# Patient Record
Sex: Male | Born: 1975 | Race: Black or African American | Hispanic: No | Marital: Married | Smoking: Former smoker
Health system: Southern US, Community
[De-identification: ages and names within clinical notes are randomized; demographics above are authoritative.]

---

## 2002-08-18 HISTORY — PX: HERNIA REPAIR: SHX51

## 2015-06-07 ENCOUNTER — Ambulatory Visit (INDEPENDENT_AMBULATORY_CARE_PROVIDER_SITE_OTHER): Payer: 59 | Admitting: Physician Assistant

## 2015-06-07 ENCOUNTER — Encounter: Payer: Self-pay | Admitting: Physician Assistant

## 2015-06-07 VITALS — BP 98/60 | HR 63 | Temp 98.3°F | Resp 16 | Ht 69.0 in | Wt 173.2 lb

## 2015-06-07 DIAGNOSIS — Z23 Encounter for immunization: Secondary | ICD-10-CM

## 2015-06-07 DIAGNOSIS — Z833 Family history of diabetes mellitus: Secondary | ICD-10-CM

## 2015-06-07 DIAGNOSIS — Z8342 Family history of familial hypercholesterolemia: Secondary | ICD-10-CM

## 2015-06-07 DIAGNOSIS — K4091 Unilateral inguinal hernia, without obstruction or gangrene, recurrent: Secondary | ICD-10-CM

## 2015-06-07 DIAGNOSIS — Z0000 Encounter for general adult medical examination without abnormal findings: Secondary | ICD-10-CM

## 2015-06-07 DIAGNOSIS — K429 Umbilical hernia without obstruction or gangrene: Secondary | ICD-10-CM

## 2015-06-07 DIAGNOSIS — Z832 Family history of diseases of the blood and blood-forming organs and certain disorders involving the immune mechanism: Secondary | ICD-10-CM | POA: Diagnosis not present

## 2015-06-07 DIAGNOSIS — Z8269 Family history of other diseases of the musculoskeletal system and connective tissue: Secondary | ICD-10-CM

## 2015-06-07 NOTE — Progress Notes (Signed)
Patient: Vincent Clark, Male    DOB: 1976-02-22, 39 y.o.   MRN: [id number] Visit Date: 06/07/2015  Today's Provider: Margaretann LovelessJennifer M Burnette, PA-C   Chief Complaint  Patient presents with  . Establish Care   Subjective:    Annual physical exam Vincent Brooklynnthony Honeycutt is a 39 y.o. male who presents today for health maintenance and complete physical. He feels well. He reports exercising, he does a lot of walking at work. He reports he is sleeping poorly, gets 4-5 hours of sleep. His sleep is decreased secondary to work. 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 He states he is essentially working from sun up to sundown and just does not get a lot of time to sleep. This is causing him to have increased fatigue. He also recently had his right upper wisdom tooth removed secondary to having the tooth crack. This was done 2 days ago. He states he is doing well postoperatively. There is no family history of prostate cancer or colon cancer. He does have a strong family history of diabetes. He also has a strong family history of coronary atherosclerosis and hypercholesterolemia. He also has a positive family history of lupus in his mother. He has a family history of bipolar disorder.  Review of Systems  Constitutional: Positive for appetite change and fatigue (due to work schedule). Negative for fever, chills and unexpected weight change.  HENT: Positive for congestion and dental problem (recent wisdom tooth extraction 2 days ago for cracked tooth). Negative for postnasal drip, rhinorrhea, sinus pressure, sneezing, tinnitus and trouble swallowing.   Eyes: Negative.   Respiratory: Positive for cough. Negative for choking, chest tightness, shortness of breath and wheezing.   Cardiovascular: Negative.   Gastrointestinal: Positive for abdominal pain (old hernia repair that did not heal appropriately). Negative for nausea, vomiting, diarrhea, constipation, blood in stool, anal bleeding and rectal  pain.  Endocrine: Negative.   Genitourinary: Negative.   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  Allergic/Immunologic: Negative.   Neurological: Positive for numbness (finger tips). Negative for dizziness, tremors, seizures, syncope, weakness, light-headedness and headaches.  Hematological: Negative.   Psychiatric/Behavioral: Negative.     Social History He  reports that he has been smoking.  He has never used smokeless tobacco. He reports that he drinks alcohol. He reports that he does not use illicit drugs. Social History   Social History  . Marital Status: Single    Spouse Name: N/A  . Number of Children: N/A  . Years of Education: N/A   Social History Main Topics  . Smoking status: Current Every Day Smoker  . Smokeless tobacco: Never Used  . Alcohol Use: Yes     Comment: Occasional  . Drug Use: No  . Sexual Activity: Not Asked   Other Topics Concern  . None   Social History Narrative  . None    There are no active problems to display for this patient.   Past Surgical History  Procedure Laterality Date  . Hernia repair  2004    Family History  Family Status  Relation Status Death Age  . Mother Alive   . Father Alive   . Brother Alive   . Maternal Uncle Alive   . Paternal Manufacturing systems engineerUncle Alive   . Maternal Grandmother Deceased   . Maternal Uncle Alive    His family history includes Asthma in his brother; Bipolar disorder  in his father and maternal uncle; Diabetes in his maternal grandmother and mother; Heart disease in his father; Mental illness in his maternal uncle.    No Known Allergies  Previous Medications   AMOXICILLIN (AMOXIL) 500 MG CAPSULE       OXYCODONE-ACETAMINOPHEN (PERCOCET/ROXICET) 5-325 MG TABLET        Patient Care Team: Margaretann Loveless, PA-C as PCP - General (Family Medicine)     Objective:   Vitals: BP 98/60 mmHg  Pulse 63  Temp(Src) 98.3 F  (36.8 C) (Oral)  Resp 16  Ht  (1.753 m)  Wt 173 lb 3.2 oz (78.563 kg)  BMI 25.57 kg/m2   Physical Exam  Constitutional: He is oriented to person, place, and time. He appears well-developed and well-nourished. No distress.  HENT:  Head: Normocephalic and atraumatic.  Right Ear: Hearing, tympanic membrane, external ear and ear canal normal.  Left Ear: Hearing, tympanic membrane, external ear and ear canal normal.  Nose: Nose normal. Right sinus exhibits no maxillary sinus tenderness and no frontal sinus tenderness. Left sinus exhibits no maxillary sinus tenderness and no frontal sinus tenderness.  Mouth/Throat: Uvula is midline, oropharynx is clear and moist and mucous membranes are normal. No oropharyngeal exudate.    Eyes: Conjunctivae and EOM are normal. Pupils are equal, round, and reactive to light. Right eye exhibits no discharge.  Neck: Trachea normal and normal range of motion. Neck supple. Muscular tenderness present. No spinous process tenderness present. No rigidity. No tracheal deviation, no edema, no erythema and normal range of motion present. No thyroid mass and no thyromegaly present.  Cardiovascular: Normal rate, regular rhythm, normal heart sounds and intact distal pulses.  Exam reveals no gallop and no friction rub.   No murmur heard. Pulmonary/Chest: Effort normal and breath sounds normal. No respiratory distress. He has no wheezes. He has no rales.  Abdominal: Soft. Normal appearance and bowel sounds are normal. He exhibits no distension and no mass. There is no hepatosplenomegaly. There is no tenderness. There is no rebound, no guarding and no CVA tenderness. A hernia is present. Hernia confirmed positive in the right inguinal area.    Musculoskeletal: Normal range of motion. He exhibits no edema or tenderness.  Lymphadenopathy:    He has no cervical adenopathy.  Neurological: He is alert and oriented to person, place, and time. He has normal reflexes. No cranial  nerve deficit. He exhibits normal muscle tone. Coordination normal.  Skin: Skin is warm and dry. No rash noted. He is not diaphoretic. No erythema.  Psychiatric: He has a normal mood and affect. His behavior is normal. Judgment and thought content normal.  Vitals reviewed.    Depression Screen No flowsheet data found.    Assessment & Plan:     Routine Health Maintenance and Physical Exam  1. Annual physical exam Physical exam was normal today with the exception of the 2 hernias reported below. These were both reducible and nontender today. He does state that occasionally he will get some tenderness in the right inguinal hernia. We will try to get labs as below. We will follow-up pending lab results or in one year for repeat annual exam if labs are normal. - TSH - CBC with Differential - Comprehensive Metabolic Panel (CMET)  2. Unilateral recurrent inguinal hernia without obstruction or gangrene He states a new while back he had a right inguinal hernia that he did undergo surgical repair. He states that he did not follow the lifting restrictions and on second  day with a heavy object and felt a pop and then felt the hernia return. It is still soft and reducible. It was nontender today on exam but he does state that he can have tenderness in the area of the hernia on days where he is very active or has to do a lot of lifting. We will continue to monitor this. I advised him that if the hernia grows in size, is unable to reduce it, or becomes very painful he is to call the office and we will consider referral to general surgery for second repair.  3. Umbilical hernia without obstruction and without gangrene He does also have a tiny umbilical hernia. This is reducible and nontender. I advised him as a did above for the inguinal hernia. He voiced understanding and will call if it worsens. If not we will reevaluate in one year at his repeat annual physical.  4. Family history of diabetes  mellitus in mother He does have a strong family history of diabetes in his mother and his siblings as well as grandparents. We will check his hemoglobin A1c. I will follow-up with him pending lab results or in one year if lab results are normal. - HgB A1c  5. Family history of systemic lupus erythematosus (SLE) in mother He does have a family history of lupus in his mother. He has never been checked for lupus. We will check ANA. Follow-up pending lab results or in one year if labs are normal. - ANA w/Reflex if Positive  6. Family history of high cholesterol History of CAD and cholesterol in his father. We will check lipid panel and follow-up pending lab results. If labs are normal I will follow-up with him in one year at his annual physical. - Lipid panel  7. Need for immunization against influenza Flu vaccine was given today without complication. - Flu Vaccine QUAD 36+ mos IM (Fluarix)   Exercise Activities and Dietary recommendations Goals    None       There is no immunization history on file for this patient.  Health Maintenance  Topic Date Due  . HIV Screening  08/14/1993  . TETANUS/TDAP  08/14/1997  . INFLUENZA VACCINE  03/19/2015      Discussed health benefits of physical activity, and encouraged him to engage in regular exercise appropriate for his age and condition.    --------------------------------------------------------------------

## 2015-06-07 NOTE — Patient Instructions (Signed)

## 2015-06-08 ENCOUNTER — Telehealth: Payer: Self-pay

## 2015-06-08 LAB — HEMOGLOBIN A1C
Est. average glucose Bld gHb Est-mCnc: 114 mg/dL
Hgb A1c MFr Bld: 5.6 % (ref 4.8–5.6)

## 2015-06-08 LAB — CBC WITH DIFFERENTIAL/PLATELET
BASOS: 1 %
Basophils Absolute: 0.1 10*3/uL (ref 0.0–0.2)
EOS (ABSOLUTE): 0.3 10*3/uL (ref 0.0–0.4)
EOS: 3 %
Hematocrit: 42.9 % (ref 37.5–51.0)
Hemoglobin: 14.5 g/dL (ref 12.6–17.7)
IMMATURE GRANULOCYTES: 0 %
Immature Grans (Abs): 0 10*3/uL (ref 0.0–0.1)
LYMPHS: 28 %
Lymphocytes Absolute: 2.9 10*3/uL (ref 0.7–3.1)
MCH: 26.8 pg (ref 26.6–33.0)
MCHC: 33.8 g/dL (ref 31.5–35.7)
MCV: 79 fL (ref 79–97)
MONOCYTES: 6 %
Monocytes Absolute: 0.7 10*3/uL (ref 0.1–0.9)
NEUTROS ABS: 6.6 10*3/uL (ref 1.4–7.0)
NEUTROS PCT: 62 %
PLATELETS: 264 10*3/uL (ref 150–379)
RBC: 5.42 x10E6/uL (ref 4.14–5.80)
RDW: 13.7 % (ref 12.3–15.4)
WBC: 10.6 10*3/uL (ref 3.4–10.8)

## 2015-06-08 LAB — TSH: TSH: 0.312 u[IU]/mL — ABNORMAL LOW (ref 0.450–4.500)

## 2015-06-08 LAB — LIPID PANEL
Chol/HDL Ratio: 2.5 ratio units (ref 0.0–5.0)
Cholesterol, Total: 123 mg/dL (ref 100–199)
HDL: 50 mg/dL (ref >39)
LDL Calculated: 55 mg/dL (ref 0–99)
Triglycerides: 90 mg/dL (ref 0–149)
VLDL CHOLESTEROL CAL: 18 mg/dL (ref 5–40)

## 2015-06-08 LAB — ANA W/REFLEX IF POSITIVE: Anti Nuclear Antibody(ANA): NEGATIVE

## 2015-06-08 LAB — COMPREHENSIVE METABOLIC PANEL
A/G RATIO: 1.6 (ref 1.1–2.5)
ALT: 13 IU/L (ref 0–44)
AST: 12 IU/L (ref 0–40)
Albumin: 4.4 g/dL (ref 3.5–5.5)
Alkaline Phosphatase: 52 IU/L (ref 39–117)
BILIRUBIN TOTAL: 0.7 mg/dL (ref 0.0–1.2)
BUN/Creatinine Ratio: 10 (ref 8–19)
BUN: 13 mg/dL (ref 6–20)
CALCIUM: 9.8 mg/dL (ref 8.7–10.2)
CHLORIDE: 102 mmol/L (ref 97–106)
CO2: 25 mmol/L (ref 18–29)
Creatinine, Ser: 1.3 mg/dL — ABNORMAL HIGH (ref 0.76–1.27)
GFR calc Af Amer: 81 mL/min/{1.73_m2} (ref >59)
GFR, EST NON AFRICAN AMERICAN: 70 mL/min/{1.73_m2} (ref >59)
GLUCOSE: 80 mg/dL (ref 65–99)
Globulin, Total: 2.7 g/dL (ref 1.5–4.5)
POTASSIUM: 4.7 mmol/L (ref 3.5–5.2)
Sodium: 143 mmol/L (ref 136–144)
Total Protein: 7.1 g/dL (ref 6.0–8.5)

## 2015-06-08 NOTE — Telephone Encounter (Signed)
-----   Message from Margaretann LovelessJennifer M Burnette, [place] sent at 06/08/2015  2:23 PM EDT ----- All labs are stable and WNL with exception to thyroid stimulating hormone which is slightly decreased.  We will recheck this again in 6 months to see if it is still low.  ANA was negative.  This was the test that test for factors with lupus.

## 2015-06-08 NOTE — Telephone Encounter (Signed)
Patient advised as directed below.  Thanks,  -Joseline 

## 2015-06-13 ENCOUNTER — Encounter: Payer: Self-pay | Admitting: Physician Assistant

## 2015-06-14 MED ORDER — ONE-A-DAY MENS PO TABS: 1.0000 | ORAL_TABLET | Freq: Every day | ORAL | Status: DC

## 2015-06-29 ENCOUNTER — Other Ambulatory Visit: Payer: Self-pay | Admitting: Physician Assistant

## 2015-06-29 DIAGNOSIS — Z91030 Bee allergy status: Secondary | ICD-10-CM

## 2015-06-29 MED ORDER — EPINEPHRINE 0.3 MG/0.3ML IJ SOAJ: 0.3000 mg | Freq: Once | INTRAMUSCULAR | Status: DC

## 2015-07-01 ENCOUNTER — Emergency Department
Admission: EM | Admit: 2015-07-01 | Discharge: 2015-07-01 | Disposition: A | Discharge status: Home / Self Care | Payer: 59 | Attending: Emergency Medicine | Admitting: Emergency Medicine

## 2015-07-01 DIAGNOSIS — Z79899 Other long term (current) drug therapy: Secondary | ICD-10-CM | POA: Diagnosis not present

## 2015-07-01 DIAGNOSIS — Y998 Other external cause status: Secondary | ICD-10-CM | POA: Diagnosis not present

## 2015-07-01 DIAGNOSIS — W458XXA Other foreign body or object entering through skin, initial encounter: Secondary | ICD-10-CM | POA: Diagnosis not present

## 2015-07-01 DIAGNOSIS — Y92009 Unspecified place in unspecified non-institutional (private) residence as the place of occurrence of the external cause: Secondary | ICD-10-CM | POA: Insufficient documentation

## 2015-07-01 DIAGNOSIS — Y9389 Activity, other specified: Secondary | ICD-10-CM | POA: Diagnosis not present

## 2015-07-01 DIAGNOSIS — Z23 Encounter for immunization: Secondary | ICD-10-CM | POA: Diagnosis not present

## 2015-07-01 DIAGNOSIS — S61012A Laceration without foreign body of left thumb without damage to nail, initial encounter: Secondary | ICD-10-CM

## 2015-07-01 DIAGNOSIS — F17200 Nicotine dependence, unspecified, uncomplicated: Secondary | ICD-10-CM | POA: Diagnosis not present

## 2015-07-01 MED ORDER — LIDOCAINE HCL (PF) 1 % IJ SOLN
2.0000 mL | Freq: Once | INTRAMUSCULAR | Status: AC
  Administered 2015-07-01: 2 mL

## 2015-07-01 MED ORDER — TETANUS-DIPHTH-ACELL PERTUSSIS 5-2.5-18.5 LF-MCG/0.5 IM SUSP
0.5000 mL | Freq: Once | INTRAMUSCULAR | Status: AC
  Administered 2015-07-01: 0.5 mL via INTRAMUSCULAR

## 2015-07-01 MED ORDER — LIDOCAINE HCL (PF) 1 % IJ SOLN
INTRAMUSCULAR | Status: AC
  Filled 2015-07-01: qty 5

## 2015-07-01 MED ORDER — TETANUS-DIPHTH-ACELL PERTUSSIS 5-2.5-18.5 LF-MCG/0.5 IM SUSP
INTRAMUSCULAR | Status: AC
  Filled 2015-07-01: qty 0.5

## 2015-07-01 NOTE — ED Notes (Signed)
Pt states that he cut his left thumb pad on a broken metal broom handle, bleeding under control

## 2015-07-01 NOTE — Discharge Instructions (Signed)
Stitches, Staples, or Adhesive Wound Closure °Health care providers use stitches (sutures), staples, and certain glue (skin adhesives) to hold skin together while it heals (wound closure). You may need this treatment after you have surgery or if you cut your skin accidentally. These methods help your skin to heal more quickly and make it less likely that you will have a scar. A wound may take several months to heal completely. °The type of wound you have determines when your wound gets closed. In most cases, the wound is closed as soon as possible (primary skin closure). Sometimes, closure is delayed so the wound can be cleaned and allowed to heal naturally. This reduces the chance of infection. Delayed closure may be needed if your wound: °· Is caused by a bite. °· Happened more than 6 hours ago. °· Involves loss of skin or the tissues under the skin. °· Has dirt or debris in it that cannot be removed. °· Is infected. °WHAT ARE THE DIFFERENT KINDS OF WOUND CLOSURES? °There are many options for wound closure. The one that your health care provider uses depends on how deep and how large your wound is. °Adhesive Glue °To use this type of glue to close a wound, your health care provider holds the edges of the wound together and paints the glue on the surface of your skin. You may need more than one layer of glue. Then the wound may be covered with a light bandage (dressing). °This type of skin closure may be used for small wounds that are not deep (superficial). Using glue for wound closure is less painful than other methods. It does not require a medicine that numbs the area (local anesthetic). This method also leaves nothing to be removed. Adhesive glue is often used for children and on facial wounds. °Adhesive glue cannot be used for wounds that are deep, uneven, or bleeding. It is not used inside of a wound.  °Adhesive Strips °These strips are made of sticky (adhesive), porous paper. They are applied across your  skin edges like a regular adhesive bandage. You leave them on until they fall off. °Adhesive strips may be used to close very superficial wounds. They may also be used along with sutures to improve the closure of your skin edges.  °Sutures °Sutures are the oldest method of wound closure. Sutures can be made from natural substances, such as silk, or from synthetic materials, such as nylon and steel. They can be made from a material that your body can break down as your wound heals (absorbable), or they can be made from a material that needs to be removed from your skin (nonabsorbable). They come in many different strengths and sizes. °Your health care provider attaches the sutures to a steel needle on one end. Sutures can be passed through your skin, or through the tissues beneath your skin. Then they are tied and cut. Your skin edges may be closed in one continuous stitch or in separate stitches. °Sutures are strong and can be used for all kinds of wounds. Absorbable sutures may be used to close tissues under the skin. The disadvantage of sutures is that they may cause skin reactions that lead to infection. Nonabsorbable sutures need to be removed. °Staples °When surgical staples are used to close a wound, the edges of your skin on both sides of the wound are brought close together. A staple is placed across the wound, and an instrument secures the edges together. Staples are often used to close surgical cuts (  incisions). °Staples are faster to use than sutures, and they cause less skin reaction. Staples need to be removed using a tool that bends the staples away from your skin. °HOW DO I CARE FOR MY WOUND CLOSURE? °· Take medicines only as directed by your health care provider. °· If you were prescribed an antibiotic medicine for your wound, finish it all even if you start to feel better. °· Use ointments or creams only as directed by your health care provider. °· Wash your hands with soap and water before and  after touching your wound. °· Do not soak your wound in water. Do not take baths, swim, or use a hot tub until your health care provider approves. °· Ask your health care provider when you can start showering. Cover your wound if directed by your health care provider. °· Do not take out your own sutures or staples. °· Do not pick at your wound. Picking can cause an infection. °· Keep all follow-up visits as directed by your health care provider. This is important. °HOW LONG WILL I HAVE MY WOUND CLOSURE? °· Leave adhesive glue on your skin until the glue peels away. °· Leave adhesive strips on your skin until the strips fall off. °· Absorbable sutures will dissolve within several days. °· Nonabsorbable sutures and staples must be removed. The location of the wound will determine how long they stay in. This can range from several days to a couple of weeks. °WHEN SHOULD I SEEK HELP FOR MY WOUND CLOSURE? °Contact your health care provider if: °· You have a fever. °· You have chills. °· You have drainage, redness, swelling, or pain at your wound. °· There is a bad smell coming from your wound. °· The skin edges of your wound start to separate after your sutures have been removed. °· Your wound becomes thick, raised, and darker in color after your sutures come out (scarring). °  °This information is not intended to replace advice given to you by your health care provider. Make sure you discuss any questions you have with your health care provider. °  °Document Released: 04/29/2001 Document Revised: 08/25/2014 Document Reviewed: 01/11/2014 °Elsevier Interactive Patient Education ©2016 [employer] ° °Sutured Wound Care °Sutures are stitches that can be used to close wounds. Taking care of your wound properly can help to prevent pain and infection. It can also help your wound to heal more quickly. °HOW TO CARE FOR YOUR SUTURED WOUND °Wound Care °· Keep the wound clean and dry. °· If you were given a bandage (dressing), you  should change it at least once per day or as directed by your health care provider. You should also change it if it becomes wet or dirty. °· Keep the wound completely dry for the first 24 hours or as directed by your health care provider. After that time, you may shower or bathe. However, make sure that the wound is not soaked in water until the sutures have been removed. °· Clean the wound one time each day or as directed by your health care provider. °¨ Wash the wound with soap and water. °¨ Rinse the wound with water to remove all soap. °¨ Pat the wound dry with a clean towel. Do not rub the wound. °· After cleaning the wound, apply a thin layer of antibiotic ointment as directed by your health care provider. This will help to prevent infection and keep the dressing from sticking to the wound. °· Have the sutures removed as directed by your   health care provider. °General Instructions °· Take or apply medicines only as directed by your health care provider. °· To help prevent scarring, make sure to cover your wound with sunscreen whenever you are outside after the sutures are removed and the wound is healed. Make sure to wear a sunscreen of at least 30 SPF. °· If you were prescribed an antibiotic medicine or ointment, finish all of it even if you start to feel better. °· Do not scratch or pick at the wound. °· Keep all follow-up visits as directed by your health care provider. This is important. °· Check your wound every day for signs of infection. Watch for:   °¨ Redness, swelling, or pain. °¨ Fluid, blood, or pus. °· Raise (elevate) the injured area above the level of your heart while you are sitting or lying down, if possible. °· Avoid stretching your wound. °· Drink enough fluids to keep your urine clear or pale yellow. °SEEK MEDICAL CARE IF: °· You received a tetanus shot and you have swelling, severe pain, redness, or bleeding at the injection site. °· You have a fever. °· A wound that was closed breaks  open. °· You notice a bad smell coming from the wound. °· You notice something coming out of the wound, such as wood or glass. °· Your pain is not controlled with medicine. °· You have increased redness, swelling, or pain at the site of your wound. °· You have fluid, blood, or pus coming from your wound. °· You notice a change in the color of your skin near your wound. °· You need to change the dressing frequently due to fluid, blood, or pus draining from the wound. °· You develop a new rash. °· You develop numbness around the wound. °SEEK IMMEDIATE MEDICAL CARE IF: °· You develop severe swelling around the injury site. °· Your pain suddenly increases and is severe. °· You develop painful lumps near the wound or on skin that is anywhere on your body. °· You have a red streak going away from your wound. °· The wound is on your hand or foot and you cannot properly move a finger or toe. °· The wound is on your hand or foot and you notice that your fingers or toes look pale or bluish. °  °This information is not intended to replace advice given to you by your health care provider. Make sure you discuss any questions you have with your health care provider. °  °Document Released: 09/11/2004 Document Revised: 12/19/2014 Document Reviewed: 03/16/2013 °Elsevier Interactive Patient Education ©2016 [employer] ° °

## 2015-07-01 NOTE — ED Provider Notes (Signed)
CSN: [id number]     Arrival date & time 07/01/15  2142 History   First MD Initiated Contact with Patient 07/01/15 2203     Chief Complaint  Patient presents with  . Laceration     (Consider location/radiation/quality/duration/timing/severity/associated sxs/prior Treatment) HPI  39 year old male presents to emergency department for evaluation of left thumb laceration. Patient states he was sleeping with a metal broom at home, cut his left thumb on the broom. Injury occurred just prior to arrival. Pain is mild. No numbness or tingling. No limited range of motion. He has not taken anything for pain. His tetanus is not up-to-date. Patient is right-hand dominant.  No past medical history on file. Past Surgical History  Procedure Laterality Date  . Hernia repair  2004   Family History  Problem Relation Age of Onset  . Diabetes Mother   . Heart disease Father   . Bipolar disorder Father   . Asthma Brother   . Bipolar disorder Maternal Uncle   . Diabetes Maternal Grandmother   . Mental illness Maternal Uncle    Social History  Substance Use Topics  . Smoking status: Current Every Day Smoker  . Smokeless tobacco: Never Used  . Alcohol Use: Yes     Comment: Occasional    Review of Systems  Constitutional: Negative.  Negative for fever, chills, activity change and appetite change.  HENT: Negative for congestion, ear pain, mouth sores, rhinorrhea, sinus pressure, sore throat and trouble swallowing.   Eyes: Negative for photophobia, pain and discharge.  Respiratory: Negative for cough, chest tightness and shortness of breath.   Cardiovascular: Negative for chest pain and leg swelling.  Gastrointestinal: Negative for nausea, vomiting, abdominal pain, diarrhea and abdominal distention.  Genitourinary: Negative for dysuria and difficulty urinating.  Musculoskeletal: Negative for back pain, arthralgias and gait problem.  Skin: Positive for wound ( left thumb). Negative for color change  and rash.  Neurological: Negative for dizziness and headaches.  Hematological: Negative for adenopathy.  Psychiatric/Behavioral: Negative for behavioral problems and agitation.      Allergies  Review of patient's allergies indicates no known allergies.  Home Medications   Prior to Admission medications   Medication Sig Start Date End Date Taking? Authorizing Provider  amoxicillin (AMOXIL) 500 MG capsule  05/28/15   Historical Provider, MD  EPINEPHrine 0.3 mg/0.3 mL IJ SOAJ injection Inject 0.3 mLs (0.3 mg total) into the muscle once. 06/29/15   Margaretann Loveless, PA-C  multivitamin (ONE-A-DAY MEN'S) TABS tablet Take 1 tablet by mouth daily. 06/14/15   Margaretann Loveless, PA-C  oxyCODONE-acetaminophen (PERCOCET/ROXICET) 5-325 MG tablet  06/04/15   Historical Provider, MD   BP 130/79 mmHg  Pulse 59  Temp(Src) 98.6 F (37 C) (Oral)  Resp 18  Ht  (1.803 m)  Wt 174 lb (78.926 kg)  BMI 24.28 kg/m2  SpO2 96% Physical Exam  Constitutional: He is oriented to person, place, and time. He appears well-developed and well-nourished.  HENT:  Head: Normocephalic and atraumatic.  Eyes: Conjunctivae and EOM are normal. Pupils are equal, round, and reactive to light.  Neck: Normal range of motion. Neck supple.  Cardiovascular: Normal rate, regular rhythm, normal heart sounds and intact distal pulses.   Pulmonary/Chest: Effort normal and breath sounds normal. No respiratory distress. He has no wheezes. He has no rales. He exhibits no tenderness.  Abdominal: Soft. Bowel sounds are normal. He exhibits no distension. There is no tenderness.  Musculoskeletal:  Left hand: Examination of left hand shows the patient  has no swelling warmth erythema. There is laceration to the volar aspect of the left thumb at the distal phalanx. There is no nail injury or soft tissue avulsion. Laceration is linear with no evidence of foreign body. Patient is non tender to palpation. He has full active flexion and  extension of the left thumb. He is neurovascularly intact.  Neurological: He is alert and oriented to person, place, and time.  Skin: Skin is warm and dry.  Psychiatric: He has a normal mood and affect. His behavior is normal. Judgment and thought content normal.    ED Course  Procedures (including critical care time) LACERATION REPAIR Performed by: Patience MuscaGAINES, THOMAS CHRISTOPHER Authorized by: Patience MuscaGAINES, THOMAS CHRISTOPHER Consent: Verbal consent obtained. Risks and benefits: risks, benefits and alternatives were discussed Consent given by: patient Patient identity confirmed: provided demographic data Prepped and Draped in normal sterile fashion Wound explored  Laceration Location: Left thumb  Laceration Length: 4 cm  No Foreign Bodies seen or palpated  Anesthesia: local infiltration  Local anesthetic: lidocaine 1% % without epinephrine  Anesthetic total: 1.5 ml  Irrigation method: syringe Amount of cleaning: standard  Skin closure: 5-0 nylon   Number of sutures: #5   Technique: Simple interrupted   Patient tolerance: Patient tolerated the procedure well with no immediate complications.  Labs Review Labs Reviewed - No data to display  Imaging Review No results found. I have personally reviewed and evaluated these images and lab results as part of my medical decision-making.   EKG Interpretation None      MDM   Final diagnoses:  Laceration of left thumb, initial encounter    39 year old male with left thumb laceration just prior to arrival. Tetanus was updated at the emergency department. Laceration was repaired with 5 nylon sutures. Patient will keep clean and dry. Follow-up in 7 days for suture removal. Patient with no signs of foreign body, neurological, tendon deficits.    Evon Slackhomas C Gaines, PA-C 07/01/15 2221  Jeanmarie PlantJames A McShane, MD 07/01/15 [phone]

## 2015-07-06 ENCOUNTER — Encounter: Payer: Self-pay | Admitting: Physician Assistant

## 2015-07-06 ENCOUNTER — Ambulatory Visit (INDEPENDENT_AMBULATORY_CARE_PROVIDER_SITE_OTHER): Payer: 59 | Admitting: Physician Assistant

## 2015-07-06 VITALS — BP 128/70 | HR 68 | Temp 98.1°F | Resp 16 | Wt 179.6 lb

## 2015-07-06 DIAGNOSIS — Z4802 Encounter for removal of sutures: Secondary | ICD-10-CM

## 2015-07-06 NOTE — Progress Notes (Signed)
       Patient: Vincent Clark Male    DOB: [birth year]   39 y.o.   MRN: [id number] Visit Date: 07/06/2015  Today's Provider: Margaretann LovelessJennifer M Burnette, PA-C   Chief Complaint  Patient presents with  . Follow-up    ER visit on 07/01/15   Subjective:    HPI Vincent Brooklynnthony Kable is a 39 year old male here following up on left thumb laceration. Patient was seen at the ED on 07/01/15 and is here for suture removal. He states that his dog and broke a metal broom in half and he was holding onto the edge of the metal broom trying to pick up some shards from it and it sliced right through his left thumb. [facility] and received sutures as well as a tetanus booster.     No Known Allergies Previous Medications   EPIPEN 2-PAK 0.3 MG/0.3ML SOAJ INJECTION       MULTIVITAMIN (ONE-A-DAY MEN'S) TABS TABLET    Take 1 tablet by mouth daily.    Review of Systems  Constitutional: Negative.   HENT: Negative.   Respiratory: Negative.   Cardiovascular: Negative.   Gastrointestinal: Negative.   Skin: Positive for wound (left thumb).    Social History  Substance Use Topics  . Smoking status: Current Every Day Smoker  . Smokeless tobacco: Never Used  . Alcohol Use: Yes     Comment: Occasional   Objective:   BP 128/70 mmHg  Pulse 68  Temp(Src) 98.1 F (36.7 C) (Oral)  Resp 16  Wt 179 lb 9.6 oz (81.466 kg)  Physical Exam  Constitutional: He appears well-developed and well-nourished. No distress.  Musculoskeletal:       Right hand: Normal.       Left hand: He exhibits laceration (left thumb palmar aspect with 5 sutures-healing well.). He exhibits normal range of motion and no tenderness. Normal sensation noted. Normal strength noted.       Hands: Skin: He is not diaphoretic.  Vitals reviewed.       Assessment & Plan:     1. Encounter for removal of sutures Sutures were removed today without complication. I did advise him to continue to keep the area clean and  dry. I did cover the laceration with a Band-Aid today. I did advise him also to try to avoid using his left thumb as much as possible until it continues to completely heal. I do feel it will continue to heal without complication. He is to call the office if he notices any redness, swelling, warmth, drainage or if he develops fevers, chills, nausea or vomiting.        Margaretann LovelessJennifer M Burnette, PA-C  [facility]

## 2015-07-06 NOTE — Patient Instructions (Signed)
Wound Care °Taking care of your wound properly can help to prevent pain and infection. It can also help your wound to heal more quickly.  °HOW TO CARE FOR YOUR WOUND  °· Take or apply over-the-counter and prescription medicines only as told by your health care provider. °· If you were prescribed antibiotic medicine, take or apply it as told by your health care provider. Do not stop using the antibiotic even if your condition improves. °· Clean the wound each day or as told by your health care provider. °¨ Wash the wound with mild soap and water. °¨ Rinse the wound with water to remove all soap. °¨ Pat the wound dry with a clean towel. Do not rub it. °· There are many different ways to close and cover a wound. For example, a wound can be covered with stitches (sutures), skin glue, or adhesive strips. Follow instructions from your health care provider about: °¨ How to take care of your wound. °¨ When and how you should change your bandage (dressing). °¨ When you should remove your dressing. °¨ Removing whatever was used to close your wound. °· Check your wound every day for signs of infection. Watch for: °¨ Redness, swelling, or pain. °¨ Fluid, blood, or pus. °· Keep the dressing dry until your health care provider says it can be removed. Do not take baths, swim, use a hot tub, or do anything that would put your wound underwater until your health care provider approves. °· Raise (elevate) the injured area above the level of your heart while you are sitting or lying down. °· Do not scratch or pick at the wound. °· Keep all follow-up visits as told by your health care provider. This is important. °SEEK MEDICAL CARE IF: °· You received a tetanus shot and you have swelling, severe pain, redness, or bleeding at the injection site. °· You have a fever. °· Your pain is not controlled with medicine. °· You have increased redness, swelling, or pain at the site of your wound. °· You have fluid, blood, or pus coming from your  wound. °· You notice a bad smell coming from your wound or your dressing. °SEEK IMMEDIATE MEDICAL CARE IF: °· You have a red streak going away from your wound. °  °This information is not intended to replace advice given to you by your health care provider. Make sure you discuss any questions you have with your health care provider. °  °Document Released: 05/13/2008 Document Revised: 12/19/2014 Document Reviewed: 07/31/2014 °Elsevier Interactive Patient Education ©2016 Elsevier Inc. ° °

## 2015-08-01 ENCOUNTER — Ambulatory Visit: Payer: Self-pay | Admitting: Physician Assistant

## 2015-08-23 ENCOUNTER — Other Ambulatory Visit: Payer: Self-pay

## 2015-08-24 ENCOUNTER — Encounter: Payer: Self-pay | Admitting: Physician Assistant

## 2015-08-24 ENCOUNTER — Ambulatory Visit (INDEPENDENT_AMBULATORY_CARE_PROVIDER_SITE_OTHER): Payer: 59 | Admitting: Physician Assistant

## 2015-08-24 VITALS — BP 90/58 | HR 72 | Temp 98.1°F | Resp 16 | Wt 168.5 lb

## 2015-08-24 DIAGNOSIS — B029 Zoster without complications: Secondary | ICD-10-CM

## 2015-08-24 DIAGNOSIS — R634 Abnormal weight loss: Secondary | ICD-10-CM

## 2015-08-24 DIAGNOSIS — F32A Depression, unspecified: Secondary | ICD-10-CM

## 2015-08-24 DIAGNOSIS — F329 Major depressive disorder, single episode, unspecified: Secondary | ICD-10-CM

## 2015-08-24 DIAGNOSIS — R630 Anorexia: Secondary | ICD-10-CM

## 2015-08-24 MED ORDER — MIRTAZAPINE 7.5 MG PO TABS: 7.5000 mg | ORAL_TABLET | Freq: Every day | ORAL | Status: DC

## 2015-08-24 MED ORDER — HYDROCODONE-ACETAMINOPHEN 5-325 MG PO TABS: 1.0000 | ORAL_TABLET | Freq: Four times a day (QID) | ORAL | Status: DC | PRN

## 2015-08-24 MED ORDER — VALACYCLOVIR HCL 1 G PO TABS: 1000.0000 mg | ORAL_TABLET | Freq: Two times a day (BID) | ORAL | Status: DC

## 2015-08-24 MED ORDER — BOOST HIGH PROTEIN PO LIQD: 1.0000 | Freq: Two times a day (BID) | ORAL | Status: DC

## 2015-08-24 NOTE — Telephone Encounter (Signed)
[facility]. I'm not sure if this will be covered but its worth a shot.

## 2015-08-24 NOTE — Patient Instructions (Signed)
Major Depressive Disorder Major depressive disorder is a mental illness. It also may be called clinical depression or unipolar depression. Major depressive disorder usually causes feelings of sadness, hopelessness, or helplessness. Some people with this disorder do not feel particularly sad but lose interest in doing things they used to enjoy (anhedonia). Major depressive disorder also can cause physical symptoms. It can interfere with work, school, relationships, and other normal everyday activities. The disorder varies in severity but is longer lasting and more serious than the sadness we all feel from time to time in our lives. Major depressive disorder often is triggered by stressful life events or major life changes. Examples of these triggers include divorce, loss of your job or home, a move, and the death of a family member or close friend. Sometimes this disorder occurs for no obvious reason at all. People who have family members with major depressive disorder or bipolar disorder are at higher risk for developing this disorder, with or without life stressors. Major depressive disorder can occur at any age. It may occur just once in your life (single episode major depressive disorder). It may occur multiple times (recurrent major depressive disorder). SYMPTOMS People with major depressive disorder have either anhedonia or depressed mood on nearly a daily basis for at least 2 weeks or longer. Symptoms of depressed mood include:  Feelings of sadness (blue or down in the dumps) or emptiness.  Feelings of hopelessness or helplessness.  Tearfulness or episodes of crying (may be observed by others).  Irritability (children and adolescents). In addition to depressed mood or anhedonia or both, people with this disorder have at least four of the following symptoms:  Difficulty sleeping or sleeping too much.   Significant change (increase or decrease) in appetite or weight.   Lack of energy or  motivation.  Feelings of guilt and worthlessness.   Difficulty concentrating, remembering, or making decisions.  Unusually slow movement (psychomotor retardation) or restlessness (as observed by others).   Recurrent wishes for death, recurrent thoughts of self-harm (suicide), or a suicide attempt. People with major depressive disorder commonly have persistent negative thoughts about themselves, other people, and the world. People with severe major depressive disorder may experiencedistorted beliefs or perceptions about the world (psychotic delusions). They also may see or hear things that are not real (psychotic hallucinations). DIAGNOSIS Major depressive disorder is diagnosed through an assessment by your health care provider. Your health care provider will ask aboutaspects of your daily life, such as mood,sleep, and appetite, to see if you have the diagnostic symptoms of major depressive disorder. Your health care provider may ask about your medical history and use of alcohol or drugs, including prescription medicines. Your health care provider also may do a physical exam and blood work. This is because certain medical conditions and the use of certain substances can cause major depressive disorder-like symptoms (secondary depression). Your health care provider also may refer you to a mental health specialist for further evaluation and treatment. TREATMENT It is important to recognize the symptoms of major depressive disorder and seek treatment. The following treatments can be prescribed for this disorder:   Medicine. Antidepressant medicines usually are prescribed. Antidepressant medicines are thought to correct chemical imbalances in the brain that are commonly associated with major depressive disorder. Other types of medicine may be added if the symptoms do not respond to antidepressant medicines alone or if psychotic delusions or hallucinations occur.  Talk therapy. Talk therapy can be  helpful in treating major depressive disorder by providing   support, education, and guidance. Certain types of talk therapy also can help with negative thinking (cognitive behavioral therapy) and with relationship issues that trigger this disorder (interpersonal therapy). A mental health specialist can help determine which treatment is best for you. Most people with major depressive disorder do well with a combination of medicine and talk therapy. Treatments involving electrical stimulation of the brain can be used in situations with extremely severe symptoms or when medicine and talk therapy do not work over time. These treatments include electroconvulsive therapy, transcranial magnetic stimulation, and vagal nerve stimulation.   This information is not intended to replace advice given to you by your health care provider. Make sure you discuss any questions you have with your health care provider.   Document Released: 11/29/2012 Document Revised: 08/25/2014 Document Reviewed: 11/29/2012 Elsevier Interactive Patient Education 2016 Elsevier Inc.  Mirtazapine tablets What is this medicine? MIRTAZAPINE (mir TAZ a peen) is used to treat depression. This medicine may be used for other purposes; ask your health care provider or pharmacist if you have questions. What should I tell my health care provider before I take this medicine? They need to know if you have any of these conditions: -bipolar disorder -glaucoma -kidney disease -liver disease -suicidal thoughts -an unusual or allergic reaction to mirtazapine, other medicines, foods, dyes, or preservatives -pregnant or trying to get pregnant -breast-feeding How should I use this medicine? Take this medicine by mouth with a glass of water. Follow the directions on the prescription label. Take your medicine at regular intervals. Do not take your medicine more often than directed. Do not stop taking this medicine suddenly except upon the advice of your  doctor. Stopping this medicine too quickly may cause serious side effects or your condition may worsen. A special MedGuide will be given to you by the pharmacist with each prescription and refill. Be sure to read this information carefully each time. Talk to your pediatrician regarding the use of this medicine in children. Special care may be needed. Overdosage: If you think you have taken too much of this medicine contact a poison control center or emergency room at once. NOTE: This medicine is only for you. Do not share this medicine with others. What if I miss a dose? If you miss a dose, take it as soon as you can. If it is almost time for your next dose, take only that dose. Do not take double or extra doses. What may interact with this medicine? Do not take this medicine with any of the following medications: -linezolid -MAOIs like Carbex, Eldepryl, Marplan, Nardil, and Parnate -methylene blue (injected into a vein) This medicine may also interact with the following medications: -alcohol -antiviral medicines for HIV or AIDS -certain medicines that treat or prevent blood clots like warfarin -certain medicines for depression, anxiety, or psychotic disturbances -certain medicines for fungal infections like ketoconazole and itraconazole -certain medicines for migraine headache like almotriptan, eletriptan, frovatriptan, naratriptan, rizatriptan, sumatriptan, zolmitriptan -certain medicines for seizures like carbamazepine or phenytoin -certain medicines for sleep -cimetidine -erythromycin -fentanyl -lithium -medicines for blood pressure -nefazodone -rasagiline -rifampin -supplements like St. John's wort, kava kava, valerian -tramadol -tryptophan This list may not describe all possible interactions. Give your health care provider a list of all the medicines, herbs, non-prescription drugs, or dietary supplements you use. Also tell them if you smoke, drink alcohol, or use illegal drugs.  Some items may interact with your medicine. What should I watch for while using this medicine? Tell your doctor if your  symptoms do not get better or if they get worse. Visit your doctor or health care professional for regular checks on your progress. Because it may take several weeks to see the full effects of this medicine, it is important to continue your treatment as prescribed by your doctor. Patients and their families should watch out for new or worsening thoughts of suicide or depression. Also watch out for sudden changes in feelings such as feeling anxious, agitated, panicky, irritable, hostile, aggressive, impulsive, severely restless, overly excited and hyperactive, or not being able to sleep. If this happens, especially at the beginning of treatment or after a change in dose, call your health care professional. Bonita QuinYou may get drowsy or dizzy. Do not drive, use machinery, or do anything that needs mental alertness until you know how this medicine affects you. Do not stand or sit up quickly, especially if you are an older patient. This reduces the risk of dizzy or fainting spells. Alcohol may interfere with the effect of this medicine. Avoid alcoholic drinks. This medicine may cause dry eyes and blurred vision. If you wear contact lenses you may feel some discomfort. Lubricating drops may help. See your eye doctor if the problem does not go away or is severe. Your mouth may get dry. Chewing sugarless gum or sucking hard candy, and drinking plenty of water may help. Contact your doctor if the problem does not go away or is severe. What side effects may I notice from receiving this medicine? Side effects that you should report to your doctor or health care professional as soon as possible: -allergic reactions like skin rash, itching or hives, swelling of the face, lips, or tongue -breathing problems -confusion -fever, sore throat, or mouth ulcers or blisters -flu like symptoms including fever,  chills, cough, muscle or joint aches and pains -stomach pain with nausea and/or vomiting -suicidal thoughts or other mood changes -swelling of the hands or feet -unusual bleeding or bruising -unusually weak or tired -vomiting Side effects that usually do not require medical attention (report to your doctor or health care professional if they continue or are bothersome): -constipation -increased appetite -weight gain This list may not describe all possible side effects. Call your doctor for medical advice about side effects. You may report side effects to FDA at 1-800-FDA-1088. Where should I keep my medicine? Keep out of the reach of children. Store at room temperature between 15 and 30 degrees C (59 and 86 degrees F) Protect from light and moisture. Throw away any unused medicine after the expiration date. NOTE: This sheet is a summary. It may not cover all possible information. If you have questions about this medicine, talk to your doctor, pharmacist, or health care provider.    2016, Elsevier/Gold Standard. (2013-02-25 13:11:19)

## 2015-08-24 NOTE — Progress Notes (Signed)
Patient: Vincent Clark Male    DOB: 1976/06/29   39 y.o.   MRN: [id number] Visit Date: 08/24/2015  Today's Provider: Margaretann Loveless, PA-C   Chief Complaint  Patient presents with  . Rash  . Poor Appetite  . Depression   Subjective:    Rash Chronicity: Possible recurrence of shingles. The problem has been gradually improving (The bumps are gone is just itching and burning) since onset. Location: Left side of [place]omen. The rash is characterized by burning and itchiness. He was exposed to nothing. Associated symptoms include fatigue. Pertinent negatives include no fever. Treatments tried: was using Valtrex.   Poor Appetite: Patient is concern about his appetite. Lately in the past week patient appetite has decreased and when gets home he prefers to sleep instead of eating something. Patient has been keeping track of his weight for the past three weeks. Patient last office visit weight was 179.6 lbs and today is 168.5 lbs. Patient states have been feeling more stress lately. Depression: Patient complains of depression. He complains of depressed mood, difficulty concentrating, fatigue, feelings of worthlessness/guilt, hopelessness, insomnia and weight loss. Onset was approximately 1 months ago, gradually worsening since that time.  He denies current suicidal and homicidal plan or intent.   Family history significant for alcoholism, anxiety, depression, heart disease and substance abuse.Possible organic causes contributing are: none.  Risk factors: positive family history in  grandmother, mother and uncle Previous treatment includes none.   He works a lot.  He has to be in [facility] around 7am and normally does not get home until after midnight. He gets up at 5 am to get ready for work. He was recently diagnosed with shingles approx. 3-4 weeks ago and has not had complete symptom resolution. He is stressed all the time and forgets to eat while he is at work. He states one day last week he  was so busy he didn't eat for 2 days and didn't even really realize it.   No Known Allergies Previous Medications   EPIPEN 2-PAK 0.3 MG/0.3ML SOAJ INJECTION       MULTIVITAMIN (ONE-A-DAY MEN'S) TABS TABLET    Take 1 tablet by mouth daily.   VALACYCLOVIR (VALTREX) 1000 MG TABLET    Reported on 08/24/2015    Review of Systems  Constitutional: Positive for appetite change, fatigue and unexpected weight change. Negative for fever.  HENT: Negative.   Eyes: Negative.   Respiratory: Negative.   Cardiovascular: Negative.   Gastrointestinal: Negative.   Endocrine: Negative.   Genitourinary: Negative.   Musculoskeletal: Negative.   Skin: Positive for rash.  Allergic/Immunologic: Negative.   Neurological: Negative.   Hematological: Negative.   Psychiatric/Behavioral: Positive for agitation. Negative for suicidal ideas, self-injury and dysphoric mood. The patient is nervous/anxious.   All other systems reviewed and are negative.   Social History  Substance Use Topics  . Smoking status: Current Every Day Smoker  . Smokeless tobacco: Never Used  . Alcohol Use: Yes     Comment: Occasional   Objective:   BP 90/58 mmHg  Pulse 72  Temp(Src) 98.1 F (36.7 C) (Oral)  Resp 16  Wt 168 lb 8 oz (76.431 kg)  Physical Exam  Constitutional: He appears well-developed and well-nourished. No distress.  HENT:  Head: Normocephalic and atraumatic.  Neck: Normal range of motion. Neck supple. No tracheal deviation present. No thyromegaly present.  Cardiovascular: Normal rate, regular rhythm and normal heart sounds.  Exam reveals no gallop and no  friction rub.   No murmur heard. Pulmonary/Chest: Effort normal and breath sounds normal. No respiratory distress. He has no wheezes. He has no rales.  Lymphadenopathy:    He has no cervical adenopathy.  Skin: Rash noted. Rash is vesicular. He is not diaphoretic.     Psychiatric: He has a normal mood and affect. His behavior is normal. Judgment and thought  content normal.  Vitals reviewed.       Assessment & Plan:     1. Shingles Being that there is a rash still present and pain I will treat with a second round of valtrex.  Pain medication given for pain management.  He is to call the office if symptoms persist after treatment. May consider treating with gabapentin for post herpetic neuralgia. - valACYclovir (VALTREX) 1000 MG tablet; Take 1 tablet (1,000 mg total) by mouth 2 (two) times daily.  Dispense: 14 tablet; Refill: 0 - HYDROcodone-acetaminophen (NORCO/VICODIN) 5-325 MG tablet; Take 1 tablet by mouth every 6 (six) hours as needed for moderate pain.  Dispense: 30 tablet; Refill: 0  2. Depression Worsening.  Most likely secondary to not eating, working too much and lack of sleep.  Will try remeron for increasing appetite and treating depression.  Advised that he needs to start taking days off work so that he can give his body time to rest and recover because he is making himself sick from working 7 days/16 hours per day. He voiced understanding.  I will see him back in 4 weeks to see how he is doing with the treatment.  He is to call the office in the meantime if symptoms worsen. - mirtazapine (REMERON) 7.5 MG tablet; Take 1 tablet (7.5 mg total) by mouth at bedtime.  Dispense: 60 tablet; Refill: 1  3. Loss of appetite See above medical treatment plan. - mirtazapine (REMERON) 7.5 MG tablet; Take 1 tablet (7.5 mg total) by mouth at bedtime.  Dispense: 60 tablet; Refill: 1       Margaretann LovelessJennifer M Raylene Carmickle, PA-C  [facility]

## 2015-09-05 ENCOUNTER — Emergency Department
Admission: EM | Admit: 2015-09-05 | Discharge: 2015-09-05 | Discharge status: Against Medical Advice | Payer: 59 | Attending: Emergency Medicine | Admitting: Emergency Medicine

## 2015-09-05 DIAGNOSIS — F17200 Nicotine dependence, unspecified, uncomplicated: Secondary | ICD-10-CM | POA: Insufficient documentation

## 2015-09-05 DIAGNOSIS — Y9389 Activity, other specified: Secondary | ICD-10-CM | POA: Diagnosis not present

## 2015-09-05 DIAGNOSIS — Y998 Other external cause status: Secondary | ICD-10-CM | POA: Diagnosis not present

## 2015-09-05 DIAGNOSIS — S199XXA Unspecified injury of neck, initial encounter: Secondary | ICD-10-CM | POA: Insufficient documentation

## 2015-09-05 DIAGNOSIS — Y92410 Unspecified street and highway as the place of occurrence of the external cause: Secondary | ICD-10-CM | POA: Insufficient documentation
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
## 2015-09-21 ENCOUNTER — Ambulatory Visit: Payer: 59 | Admitting: Physician Assistant

## 2015-10-26 ENCOUNTER — Ambulatory Visit: Payer: Self-pay | Admitting: Physician Assistant

## 2015-10-29 ENCOUNTER — Ambulatory Visit (INDEPENDENT_AMBULATORY_CARE_PROVIDER_SITE_OTHER): Payer: 59 | Admitting: Physician Assistant

## 2015-10-29 ENCOUNTER — Encounter: Payer: Self-pay | Admitting: Physician Assistant

## 2015-10-29 VITALS — BP 108/60 | HR 64 | Temp 98.0°F | Resp 16 | Wt 176.6 lb

## 2015-10-29 DIAGNOSIS — F419 Anxiety disorder, unspecified: Secondary | ICD-10-CM

## 2015-10-29 NOTE — Progress Notes (Signed)
Patient: Vincent Clark Male    DOB: 23-Jun-1976   39 y.o.   MRN: [id number] Visit Date: 10/29/2015  Today's Provider: Margaretann Loveless, PA-C   Chief Complaint  Patient presents with  . Chest Pain   Subjective:    Chest Pain  This is a new problem. The current episode started 1 to 4 weeks ago (He states he has been having chest pain in the last few months but now they have been more frequently in the last week in a half). The problem occurs daily. The problem has been gradually worsening. The pain is present in the epigastric region. The pain is at a severity of 4/10. The pain is mild. The quality of the pain is described as stabbing. The pain does not radiate. Associated symptoms include abdominal pain (LRQ), back pain (every day), palpitations and shortness of breath (when the pain happens he has to take deep breath). Pertinent negatives include no dizziness, exertional chest pressure, irregular heartbeat, leg pain, lower extremity edema, malaise/fatigue, nausea, near-syncope, numbness, syncope, vomiting or weakness. The pain is aggravated by emotional upset. Treatments tried: Taking deep breath. The treatment provided mild relief. Risk factors include stress.        No Known Allergies Previous Medications   EPIPEN 2-PAK 0.3 MG/0.3ML SOAJ INJECTION       FEEDING SUPPLEMENT (BOOST HIGH PROTEIN) LIQD    Take 237 mLs by mouth 2 (two) times daily between meals.   HYDROCODONE-ACETAMINOPHEN (NORCO/VICODIN) 5-325 MG TABLET    Take 1 tablet by mouth every 6 (six) hours as needed for moderate pain.   MIRTAZAPINE (REMERON) 7.5 MG TABLET    Take 1 tablet (7.5 mg total) by mouth at bedtime.   MULTIVITAMIN (ONE-A-DAY MEN'S) TABS TABLET    Take 1 tablet by mouth daily.   VALACYCLOVIR (VALTREX) 1000 MG TABLET    Take 1 tablet (1,000 mg total) by mouth 2 (two) times daily.    Review of Systems  Constitutional: Negative.  Negative for malaise/fatigue.  Respiratory: Positive for shortness of  breath (when the pain happens he has to take deep breath). Negative for chest tightness and wheezing.   Cardiovascular: Positive for chest pain and palpitations. Negative for leg swelling, syncope and near-syncope.  Gastrointestinal: Positive for abdominal pain (LRQ). Negative for nausea and vomiting.  Musculoskeletal: Positive for back pain (every day).  Neurological: Negative for dizziness, weakness and numbness.  Psychiatric/Behavioral: Positive for agitation. Negative for dysphoric mood. The patient is not nervous/anxious.     Social History  Substance Use Topics  . Smoking status: Former Games developer  . Smokeless tobacco: Never Used     Comment: one month ago. 09/19/15  . Alcohol Use: Yes     Comment: Occasional   Objective:   BP 108/60 mmHg  Pulse 64  Temp(Src) 98 F (36.7 C) (Oral)  Resp 16  Wt 176 lb 9.6 oz (80.105 kg)  Physical Exam  Constitutional: He is oriented to person, place, and time. He appears well-developed and well-nourished. No distress.  Cardiovascular: Normal rate, regular rhythm, normal heart sounds and intact distal pulses.  Exam reveals no gallop and no friction rub.   No murmur heard. Pulmonary/Chest: Effort normal and breath sounds normal. No respiratory distress. He has no wheezes. He has no rales. He exhibits no tenderness.  Abdominal: Soft. Normal appearance and bowel sounds are normal. He exhibits no distension and no mass. There is no hepatosplenomegaly. There is no tenderness. There is no rebound, no  guarding and no CVA tenderness.  Neurological: He is alert and oriented to person, place, and time.  Skin: Skin is warm and dry. He is not diaphoretic.  Psychiatric: He has a normal mood and affect. His behavior is normal. Judgment and thought content normal.  Vitals reviewed.       Assessment & Plan:     1. Acute anxiety I do feel his symptoms are secondary to anxiety and stress with his work.  He states the symptoms only happen at work.  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 We discussed in detail coping mechanisms, relaxation techniques and physical activity to help with his symptoms. He does not wish to take any medication and is going to try to control symptoms with these techniques.  He is to call the office if symptoms worsen.       Margaretann LovelessJennifer M Burnette, PA-C  [facility]

## 2015-10-29 NOTE — Patient Instructions (Signed)
Generalized Anxiety Disorder Generalized anxiety disorder (GAD) is a mental disorder. It interferes with life functions, including relationships, work, and school. GAD is different from normal anxiety, which everyone experiences at some point in their lives in response to specific life events and activities. Normal anxiety actually helps us prepare for and get through these life events and activities. Normal anxiety goes away after the event or activity is over.  GAD causes anxiety that is not necessarily related to specific events or activities. It also causes excess anxiety in proportion to specific events or activities. The anxiety associated with GAD is also difficult to control. GAD can vary from mild to severe. People with severe GAD can have intense waves of anxiety with physical symptoms (panic attacks).  SYMPTOMS The anxiety and worry associated with GAD are difficult to control. This anxiety and worry are related to many life events and activities and also occur more days than not for 6 months or longer. People with GAD also have three or more of the following symptoms (one or more in children):  Restlessness.   Fatigue.  Difficulty concentrating.   Irritability.  Muscle tension.  Difficulty sleeping or unsatisfying sleep. DIAGNOSIS GAD is diagnosed through an assessment by your health care provider. Your health care provider will ask you questions aboutyour mood,physical symptoms, and events in your life. Your health care provider may ask you about your medical history and use of alcohol or drugs, including prescription medicines. Your health care provider may also do a physical exam and blood tests. Certain medical conditions and the use of certain substances can cause symptoms similar to those associated with GAD. Your health care provider may refer you to a mental health specialist for further evaluation. TREATMENT The following therapies are usually used to treat GAD:    Medication. Antidepressant medication usually is prescribed for long-term daily control. Antianxiety medicines may be added in severe cases, especially when panic attacks occur.   Talk therapy (psychotherapy). Certain types of talk therapy can be helpful in treating GAD by providing support, education, and guidance. A form of talk therapy called cognitive behavioral therapy can teach you healthy ways to think about and react to daily life events and activities.  Stress managementtechniques. These include yoga, meditation, and exercise and can be very helpful when they are practiced regularly. A mental health specialist can help determine which treatment is best for you. Some people see improvement with one therapy. However, other people require a combination of therapies.   This information is not intended to replace advice given to you by your health care provider. Make sure you discuss any questions you have with your health care provider.   Document Released: 11/29/2012 Document Revised: 08/25/2014 Document Reviewed: 11/29/2012 Elsevier Interactive Patient Education 2016 Elsevier Inc.  

## 2015-11-08 ENCOUNTER — Encounter: Payer: Self-pay | Admitting: Physician Assistant

## 2015-11-08 DIAGNOSIS — R110 Nausea: Secondary | ICD-10-CM

## 2015-11-08 DIAGNOSIS — J0100 Acute maxillary sinusitis, unspecified: Secondary | ICD-10-CM

## 2015-11-08 MED ORDER — ONDANSETRON HCL 4 MG PO TABS: 4.0000 mg | ORAL_TABLET | Freq: Three times a day (TID) | ORAL | Status: DC | PRN

## 2015-11-08 MED ORDER — AMOXICILLIN-POT CLAVULANATE 875-125 MG PO TABS: 1.0000 | ORAL_TABLET | Freq: Two times a day (BID) | ORAL | Status: DC

## 2015-11-08 NOTE — Addendum Note (Signed)
Addended by: Margaretann LovelessBURNETTE, Jarae Nemmers M on: 11/08/2015 10:12 AM   Modules accepted: Orders

## 2015-11-08 NOTE — Addendum Note (Signed)
Addended by: Margaretann LovelessBURNETTE, [place] M on: 11/08/2015 09:56 AM   Modules accepted: Orders

## 2015-11-12 ENCOUNTER — Encounter: Payer: Self-pay | Admitting: Physician Assistant

## 2016-03-31 ENCOUNTER — Encounter: Payer: Self-pay | Admitting: Physician Assistant

## 2016-03-31 ENCOUNTER — Ambulatory Visit (INDEPENDENT_AMBULATORY_CARE_PROVIDER_SITE_OTHER): Payer: 59 | Admitting: Physician Assistant

## 2016-03-31 VITALS — BP 120/70 | HR 67 | Temp 98.0°F | Resp 16 | Wt 193.4 lb

## 2016-03-31 DIAGNOSIS — Z0000 Encounter for general adult medical examination without abnormal findings: Secondary | ICD-10-CM

## 2016-03-31 DIAGNOSIS — Z0189 Encounter for other specified special examinations: Secondary | ICD-10-CM

## 2016-03-31 DIAGNOSIS — Z008 Encounter for other general examination: Secondary | ICD-10-CM

## 2016-03-31 NOTE — Patient Instructions (Addendum)

## 2016-03-31 NOTE — Progress Notes (Signed)
Patient: Vincent Clark, Male    DOB: [birth year], 40 y.o.   MRN: [id number] Visit Date: 03/31/2016  Today's Provider: Margaretann LovelessJennifer M Burnette, PA-C   Chief Complaint  Patient presents with  . Annual Exam   Subjective:    Annual physical exam Vincent Clark is a 40 y.o. male who presents today for health maintenance and complete physical. He feels well. He reports exercising none. He reports he is sleeping well. Work has gotten much better than previous and he actually just had a vacation as well.   -----------------------------------------------------------------   Review of Systems  Constitutional: Negative.   HENT: Negative.   Eyes: Negative.   Respiratory: Negative.   Cardiovascular: Positive for leg swelling (at the end of the day; up on feet a lot; left > right). Negative for chest pain and palpitations.  Gastrointestinal: Negative.   Endocrine: Negative.   Genitourinary: Negative.   Musculoskeletal: Positive for back pain. Negative for arthralgias, joint swelling and myalgias.  Allergic/Immunologic: Positive for environmental allergies.  Neurological: Negative.   Hematological: Negative.   Psychiatric/Behavioral: Positive for agitation (improving). Negative for dysphoric mood and sleep disturbance. The patient is not nervous/anxious.     Social History      He  reports that he has quit smoking. He has never used smokeless tobacco. He reports that he drinks alcohol. He reports that he does not use drugs.       Social History   Social History  . Marital status: Single    Spouse name: N/A  . Number of children: N/A  . Years of education: N/A   Social History Main Topics  . Smoking status: Former Games developermoker  . Smokeless tobacco: Never Used     Comment: one month ago. 09/19/15  . Alcohol use Yes     Comment: Occasional  . Drug use: No  . Sexual activity: Yes   Other Topics Concern  . None   Social History Narrative  . None    History reviewed. No pertinent  past medical history.   Patient Active Problem List   Diagnosis Date Noted  . History of systemic reaction to bee sting 06/29/2015    Past Surgical History:  Procedure Laterality Date  . HERNIA REPAIR  2004    Family History        Family Status  Relation Status  . Mother Alive  . Father Alive  . Brother Alive  . Maternal Uncle Alive  . Paternal Manufacturing systems engineerUncle Alive  . Maternal Grandmother Deceased  . Maternal Uncle Alive        His family history includes Asthma in his brother; Bipolar disorder in his father and maternal uncle; Diabetes in his maternal grandmother and mother; Heart disease in his father; Mental illness in his maternal uncle.    Allergies  Allergen Reactions  . Other     Other reaction(s): Other (See Comments) Bees/wasp/hornets- swelling/cant breathe    No outpatient prescriptions have been marked as taking for the 03/31/16 encounter (Office Visit) with Margaretann LovelessJennifer M Burnette, PA-C.    Patient Care Team: Margaretann LovelessJennifer M Burnette, PA-C as PCP - General (Family Medicine)     Objective:   Vitals: BP 120/70 (BP Location: Left Arm, Patient Position: Sitting, Cuff Size: Normal)   Pulse 67   Temp 98 F (36.7 C) (Oral)   Resp 16   Wt 193 lb 6.4 oz (87.7 kg)   BMI 28.56 kg/m    Physical Exam  Constitutional: He is oriented to  person, place, and time. He appears well-developed and well-nourished.  HENT:  Head: Normocephalic and atraumatic.  Right Ear: Tympanic membrane, external ear and ear canal normal.  Left Ear: Tympanic membrane, external ear and ear canal normal.  Nose: Nose normal.  Mouth/Throat: Uvula is midline, oropharynx is clear and moist and mucous membranes are normal.  Eyes: Conjunctivae and EOM are normal. Pupils are equal, round, and reactive to light. Right eye exhibits no discharge.  Neck: Normal range of motion. Neck supple. No tracheal deviation present. No thyromegaly present.  Cardiovascular: Normal rate, regular rhythm, normal heart sounds and  intact distal pulses.   No murmur heard. Pulmonary/Chest: Effort normal and breath sounds normal. No respiratory distress. He has no wheezes. He has no rales. He exhibits no tenderness.  Abdominal: Soft. He exhibits no distension and no mass. There is no tenderness. There is no rebound and no guarding.  Musculoskeletal: Normal range of motion. He exhibits no edema or tenderness.  Lymphadenopathy:    He has no cervical adenopathy.  Neurological: He is alert and oriented to person, place, and time. He has normal reflexes. No cranial nerve deficit. He exhibits normal muscle tone. Coordination normal.  Skin: Skin is warm and dry. No rash noted. No erythema.  Psychiatric: He has a normal mood and affect. His behavior is normal. Judgment and thought content normal.     Depression Screen PHQ 2/9 Scores 03/31/2016 08/24/2015 08/24/2015  PHQ - 2 Score 0 6 6  PHQ- 9 Score - 22 22    Assessment & Plan:     Routine Health Maintenance and Physical Exam  Exercise Activities and Dietary recommendations Goals    None      Immunization History  Administered Date(s) Administered  . Influenza,inj,Quad PF,36+ Mos 06/07/2015  . Tdap 07/01/2015    Health Maintenance  Topic Date Due  . HIV Screening  08/14/1993  . INFLUENZA VACCINE  03/18/2016  . TETANUS/TDAP  06/30/2025      Discussed health benefits of physical activity, and encouraged him to engage in regular exercise appropriate for his age and condition.   1. Annual physical exam Normal physical exam today. Will check labs as below and f/u pending lab results. If labs are stable and WNL he will not need to have these rechecked for one year at his next annual physical exam. He is to call the office in the meantime if he has any acute issue, questions or concerns. - CBC with Differential/Platelet - Comprehensive metabolic panel - Hemoglobin A1c - Lipid panel - TSH  2. Encounter for biometric screening Will check labs as below and f/u  pending results. Will fill out and fax biometric screening form once results are received.  - CBC with Differential/Platelet - Comprehensive metabolic panel - Hemoglobin A1c - Lipid panel - TSH   --------------------------------------------------------------------    Margaretann LovelessJennifer M Burnette, PA-C  [facility]

## 2016-04-01 LAB — HEMOGLOBIN A1C
Est. average glucose Bld gHb Est-mCnc: 117 mg/dL
Hgb A1c MFr Bld: 5.7 % — ABNORMAL HIGH (ref 4.8–5.6)

## 2016-04-01 LAB — CBC WITH DIFFERENTIAL/PLATELET
Basophils Absolute: 0 x10E3/uL (ref 0.0–0.2)
Basos: 0 %
EOS (ABSOLUTE): 0.4 x10E3/uL (ref 0.0–0.4)
Eos: 5 %
Hematocrit: 40.4 % (ref 37.5–51.0)
Hemoglobin: 13.7 g/dL (ref 12.6–17.7)
Immature Grans (Abs): 0 x10E3/uL (ref 0.0–0.1)
Immature Granulocytes: 0 %
Lymphocytes Absolute: 3 x10E3/uL (ref 0.7–3.1)
Lymphs: 32 %
MCH: 25.8 pg — ABNORMAL LOW (ref 26.6–33.0)
MCHC: 33.9 g/dL (ref 31.5–35.7)
MCV: 76 fL — ABNORMAL LOW (ref 79–97)
Monocytes Absolute: 0.7 x10E3/uL (ref 0.1–0.9)
Monocytes: 8 %
Neutrophils Absolute: 5.2 x10E3/uL (ref 1.4–7.0)
Neutrophils: 55 %
Platelets: 250 x10E3/uL (ref 150–379)
RBC: 5.3 x10E6/uL (ref 4.14–5.80)
RDW: 14.2 % (ref 12.3–15.4)
WBC: 9.4 x10E3/uL (ref 3.4–10.8)

## 2016-04-01 LAB — TSH: TSH: 1.17 u[IU]/mL (ref 0.450–4.500)

## 2016-04-01 LAB — COMPREHENSIVE METABOLIC PANEL WITH GFR
ALT: 13 IU/L (ref 0–44)
AST: 12 IU/L (ref 0–40)
Albumin/Globulin Ratio: 1.8 (ref 1.2–2.2)
Albumin: 4.2 g/dL (ref 3.5–5.5)
Alkaline Phosphatase: 49 IU/L (ref 39–117)
BUN/Creatinine Ratio: 13 (ref 9–20)
BUN: 14 mg/dL (ref 6–20)
Bilirubin Total: 0.5 mg/dL (ref 0.0–1.2)
CO2: 25 mmol/L (ref 18–29)
Calcium: 9.2 mg/dL (ref 8.7–10.2)
Chloride: 103 mmol/L (ref 96–106)
Creatinine, Ser: 1.06 mg/dL (ref 0.76–1.27)
GFR calc Af Amer: 103 mL/min/1.73
GFR calc non Af Amer: 89 mL/min/1.73
Globulin, Total: 2.4 g/dL (ref 1.5–4.5)
Glucose: 90 mg/dL (ref 65–99)
Potassium: 4 mmol/L (ref 3.5–5.2)
Sodium: 140 mmol/L (ref 134–144)
Total Protein: 6.6 g/dL (ref 6.0–8.5)

## 2016-04-01 LAB — LIPID PANEL
Chol/HDL Ratio: 2.2 ratio (ref 0.0–5.0)
Cholesterol, Total: 139 mg/dL (ref 100–199)
HDL: 64 mg/dL
LDL Calculated: 57 mg/dL (ref 0–99)
Triglycerides: 89 mg/dL (ref 0–149)
VLDL Cholesterol Cal: 18 mg/dL (ref 5–40)

## 2016-09-22 ENCOUNTER — Encounter: Payer: Self-pay | Admitting: Physician Assistant

## 2016-09-22 DIAGNOSIS — R0681 Apnea, not elsewhere classified: Secondary | ICD-10-CM

## 2016-09-22 DIAGNOSIS — R0689 Other abnormalities of breathing: Secondary | ICD-10-CM

## 2016-09-22 DIAGNOSIS — R0683 Snoring: Secondary | ICD-10-CM

## 2016-10-08 ENCOUNTER — Telehealth: Payer: Self-pay | Admitting: Physician Assistant

## 2016-10-08 NOTE — Telephone Encounter (Signed)
Patient advised as directed below. Per patient is going to check his schedule and will [facility]  Thanks,  -Joseline

## 2016-10-08 NOTE — Telephone Encounter (Signed)
Patient has a sleep study scheduled on 10/22/16. He needs to see me prior to this appt for it to be authorized by his insurance.

## 2016-10-13 ENCOUNTER — Encounter: Payer: Self-pay | Admitting: Physician Assistant

## 2016-10-13 ENCOUNTER — Encounter: Payer: 59 | Admitting: Physician Assistant

## 2016-10-14 ENCOUNTER — Encounter: Payer: Self-pay | Admitting: Physician Assistant

## 2016-10-14 ENCOUNTER — Ambulatory Visit (INDEPENDENT_AMBULATORY_CARE_PROVIDER_SITE_OTHER): Payer: 59 | Admitting: Physician Assistant

## 2016-10-14 VITALS — BP 100/60 | HR 62 | Temp 98.0°F | Resp 16 | Ht 69.0 in | Wt 195.6 lb

## 2016-10-14 DIAGNOSIS — R0681 Apnea, not elsewhere classified: Secondary | ICD-10-CM

## 2016-10-14 DIAGNOSIS — R400 Somnolence: Secondary | ICD-10-CM

## 2016-10-14 DIAGNOSIS — R0683 Snoring: Secondary | ICD-10-CM | POA: Diagnosis not present

## 2016-10-14 NOTE — Patient Instructions (Signed)

## 2016-10-14 NOTE — Progress Notes (Signed)
Patient: Vincent Clark Male    DOB: [birth year]   41 y.o.   MRN: [id number] Visit Date: 10/14/2016  Today's Provider: Margaretann LovelessJennifer M Burnette, PA-C   Chief Complaint  Patient presents with  . Snoring   Subjective:    HPI Patient here today c/o sleep apnea patient reports that symptoms started in 1997. Patient reports that his wife as witnessed his sleep apnea. Patient reports snoring and feeling fatigue most days. Patient reports he wakes up once or twice during the night.  Patient filled out a Epworth sleepiness scale. Score: 5. He does report weekly occurrences of observed apnea, interrupted sleep, leg symptoms, teeth grinding, headache in the mornings, and not feeling well rested.    Dep[facility] 10/14/2016 03/31/2016 08/24/2015 08/24/2015  Decreased Interest 0 0 3 3  Down, Depressed, Hopeless 0 0 3 3  PHQ - 2 Score 0 0 6 6  Altered sleeping 3 - 3 3  Tired, decreased energy 3 - 3 2  Change in appetite 1 - 3 3  Feeling bad or failure about yourself  0 - 3 3  Trouble concentrating 1 - 2 2  Moving slowly or fidgety/restless 2 - 2 3  Suicidal thoughts 0 - 0 0  PHQ-9 Score 10 - 22 22  Difficult doing work/chores Somewhat difficult - Somewhat difficult Somewhat difficult    Allergies  Allergen Reactions  . Other     Other reaction(s): Other (See Comments) Bees/wasp/hornets- swelling/cant breathe     Current Outpatient Prescriptions:  .  EPIPEN 2-PAK 0.3 MG/0.3ML SOAJ injection, , Disp: , Rfl:   Review of Systems  Constitutional: Positive for fatigue.  Respiratory: Negative.   Cardiovascular: Negative.   Gastrointestinal: Negative.   Neurological: Negative for dizziness, light-headedness, numbness and headaches.  Psychiatric/Behavioral: Positive for sleep disturbance. Negative for agitation, dysphoric mood, self-injury and suicidal ideas. The patient is not nervous/anxious.     Social History  Substance Use Topics  . Smoking status: Former Smoker    Types:  E-cigarettes  . Smokeless tobacco: Never Used     Comment: one month ago. 09/19/15  . Alcohol use 4.8 oz/week    8 Shots of liquor per week     Comment: Occasional   Objective:   BP 100/60 (BP Location: Left Arm, Patient Position: Sitting, Cuff Size: Large)   Pulse 62   Temp 98 F (36.7 C) (Oral)   Resp 16   Ht 5\' 9"  (1.753 m)   Wt 195 lb 9.6 oz (88.7 kg)   SpO2 98%   BMI 28.89 kg/m   Physical Exam  Constitutional: He appears well-developed and well-nourished. No distress.  HENT:  Head: Normocephalic and atraumatic.  Neck: Normal range of motion. Neck supple. No JVD present. No tracheal deviation present. No thyromegaly present.  Cardiovascular: Normal rate, regular rhythm and normal heart sounds.  Exam reveals no gallop and no friction rub.   No murmur heard. Pulmonary/Chest: Effort normal and breath sounds normal. No respiratory distress. He has no wheezes. He has no rales.  Musculoskeletal: He exhibits no edema.  Lymphadenopathy:    He has no cervical adenopathy.  Skin: He is not diaphoretic.  Psychiatric: He has a normal mood and affect. His behavior is normal. Judgment and thought content normal.  Vitals reviewed.     Assessment & Plan:     1. Snoring Patient and his wife report increased snoring and he has at least one episode of witnessed apnea per week.  He also has been having worsening daytime somnolence and fatigue. He also has episodes once per week of waking gasping for air. These have been worsening over the last 6 months. Sleep study has been ordered and is scheduled for 10/22/16. Labs with his CPE were normal.   2. Witnessed episode of apnea See above medical treatment plan.  3. Daytime somnolence See above medical treatment plan.       Margaretann Loveless, PA-C  [facility]

## 2016-10-17 ENCOUNTER — Encounter: Payer: Self-pay | Admitting: Physician Assistant

## 2016-10-20 ENCOUNTER — Encounter: Payer: Self-pay | Admitting: Physician Assistant

## 2016-10-30 ENCOUNTER — Encounter: Payer: Self-pay | Admitting: Physician Assistant

## 2016-11-13 ENCOUNTER — Encounter: Payer: Self-pay | Admitting: Physician Assistant

## 2017-01-28 ENCOUNTER — Ambulatory Visit: Payer: 59 | Admitting: Physician Assistant

## 2017-03-26 ENCOUNTER — Telehealth: Payer: Self-pay

## 2017-03-26 NOTE — Telephone Encounter (Signed)
Received a call yesterday in regards to the patient, patient ended up going to urgent care on 03/24/17 and was treated for symptoms he was having such as fever of 102 as Lymes disease and was started on Doxy. Patient was still running a fever yesterday 03/25/17 of 100. Spoke with Joycelyn ManJennifer Burnette, PA and advised patient per her patient to take Tylenol and Ibuprofen alternating every 3 hours for fever, push fluids and continue antibiotic and if he is not doing better let us know today. Could not put this message in the computer yesterday because system was down.-aa

## 2017-04-03 ENCOUNTER — Ambulatory Visit (INDEPENDENT_AMBULATORY_CARE_PROVIDER_SITE_OTHER): Payer: 59 | Admitting: Physician Assistant

## 2017-04-03 ENCOUNTER — Encounter: Payer: Self-pay | Admitting: Physician Assistant

## 2017-04-03 VITALS — BP 100/64 | HR 60 | Temp 98.2°F | Resp 16 | Ht 69.0 in | Wt 187.0 lb

## 2017-04-03 DIAGNOSIS — I83893 Varicose veins of bilateral lower extremities with other complications: Secondary | ICD-10-CM | POA: Diagnosis not present

## 2017-04-03 DIAGNOSIS — Z136 Encounter for screening for cardiovascular disorders: Secondary | ICD-10-CM

## 2017-04-03 DIAGNOSIS — Z131 Encounter for screening for diabetes mellitus: Secondary | ICD-10-CM | POA: Diagnosis not present

## 2017-04-03 DIAGNOSIS — Z0000 Encounter for general adult medical examination without abnormal findings: Secondary | ICD-10-CM

## 2017-04-03 DIAGNOSIS — Z6827 Body mass index (BMI) 27.0-27.9, adult: Secondary | ICD-10-CM | POA: Diagnosis not present

## 2017-04-03 DIAGNOSIS — Z13220 Encounter for screening for lipoid disorders: Secondary | ICD-10-CM

## 2017-04-03 NOTE — Progress Notes (Signed)
Patient: Vincent Clark, Male    DOB: 1976-01-17, 41 y.o.   MRN: [id number] Visit Date: 04/03/2017  Today's Provider: Margaretann Loveless, PA-C   Chief Complaint  Patient presents with  . Annual Exam   Subjective:    Annual physical exam Vincent Clark is a 41 y.o. male who presents today for health maintenance and complete physical. He feels well. He reports exercising none. He reports he is sleeping poorly. Patient reports getting 4-5 hours of sleep. 03/31/16 CPE -----------------------------------------------------------------   Review of Systems  Constitutional: Positive for diaphoresis and fatigue.  HENT: Negative.   Eyes: Negative.   Respiratory: Negative.   Cardiovascular: Positive for leg swelling.  Gastrointestinal: Positive for abdominal pain.  Endocrine: Negative.   Genitourinary: Negative.   Musculoskeletal: Positive for neck stiffness.  Skin: Negative.   Allergic/Immunologic: Negative.   Neurological: Positive for weakness and headaches.  Hematological: Negative.   Psychiatric/Behavioral: Positive for agitation, confusion, dysphoric mood and sleep disturbance.    Social History      He  reports that he has quit smoking. His smoking use included E-cigarettes. He has never used smokeless tobacco. He reports that he drinks about 4.8 oz of alcohol per week . He reports that he does not use drugs.       Social History   Social History  . Marital status: Married    Spouse name: N/A  . Number of children: N/A  . Years of education: N/A   Social History Main Topics  . Smoking status: Former Smoker    Types: E-cigarettes  . Smokeless tobacco: Never Used     Comment: one month ago. 09/19/15  . Alcohol use 4.8 oz/week    8 Shots of liquor per week     Comment: Occasional  . Drug use: No  . Sexual activity: Yes   Other Topics Concern  . None   Social History Narrative  . None    History reviewed. No pertinent past medical history.   Patient  Active Problem List   Diagnosis Date Noted  . History of systemic reaction to bee sting 06/29/2015    Past Surgical History:  Procedure Laterality Date  . HERNIA REPAIR  2004    Family History        Family Status  Relation Status  . Mother Alive  . Father Alive  . Brother Alive  . Mat [employer]  . [employer] Alive  . [employer] Deceased  . Mat Uncle Alive        His family history includes Asthma in his brother; Bipolar disorder in his father and maternal uncle; Diabetes in his maternal grandmother and mother; Heart disease in his father; Mental illness in his maternal uncle.     Allergies  Allergen Reactions  . Other     Other reaction(s): Other (See Comments) Bees/wasp/hornets- swelling/cant breathe     Current Outpatient Prescriptions:  .  EPIPEN 2-PAK 0.3 MG/0.3ML SOAJ injection, , Disp: , Rfl:    Patient Care Team: Reine Just as PCP - General (Family Medicine)      Objective:   Vitals: BP 100/64 (BP Location: Left Arm, Patient Position: Sitting, Cuff Size: Large)   Pulse 60   Temp 98.2 F (36.8 C) (Oral)   Resp 16   Ht 5\' 9"  (1.753 m)   Wt 187 lb (84.8 kg)   SpO2 98%   BMI 27.62 kg/m    Vitals:   04/03/17 1430  BP:  100/64  Pulse: 60  Resp: 16  Temp: 98.2 F (36.8 C)  TempSrc: Oral  SpO2: 98%  Weight: 187 lb (84.8 kg)  Height: 5\' 9"  (1.753 m)     Physical Exam  Constitutional: He is oriented to person, place, and time. He appears well-developed and well-nourished.  HENT:  Head: Normocephalic and atraumatic.  Right Ear: Hearing, tympanic membrane, external ear and ear canal normal.  Left Ear: Hearing, tympanic membrane, external ear and ear canal normal.  Nose: Nose normal.  Mouth/Throat: Uvula is midline, oropharynx is clear and moist and mucous membranes are normal.  Eyes: Pupils are equal, round, and reactive to light. Conjunctivae and EOM are normal. Right eye exhibits no discharge.  Neck: Normal range of motion. Neck supple.  No tracheal deviation present. No thyromegaly present.  Cardiovascular: Normal rate, regular rhythm, normal heart sounds and intact distal pulses.   No murmur heard. Pulmonary/Chest: Effort normal and breath sounds normal. No respiratory distress. He has no wheezes. He has no rales. He exhibits no tenderness.  Abdominal: Soft. He exhibits no distension and no mass. There is no tenderness. There is no rebound and no guarding.  Musculoskeletal: Normal range of motion. He exhibits edema (trace bilaterally). He exhibits no tenderness.  Lymphadenopathy:    He has no cervical adenopathy.  Neurological: He is alert and oriented to person, place, and time. He has normal reflexes. No cranial nerve deficit. He exhibits normal muscle tone. Coordination normal.  Skin: Skin is warm and dry. No rash noted. No erythema.  Psychiatric: He has a normal mood and affect. His behavior is normal. Judgment and thought content normal.  Vitals reviewed.    Depression Screen PHQ 2/9 Scores 04/03/2017 10/14/2016 03/31/2016 08/24/2015  PHQ - 2 Score 1 0 0 6  PHQ- 9 Score 10 10 - 22      Assessment & Plan:     Routine Health Maintenance and Physical Exam  Exercise Activities and Dietary recommendations Goals    None      Immunization History  Administered Date(s) Administered  . Influenza,inj,Quad PF,36+ Mos 06/07/2015  . Tdap 07/01/2015    Health Maintenance  Topic Date Due  . HIV Screening  08/14/1993  . INFLUENZA VACCINE  05/14/2017 (Originally 03/18/2017)  . TETANUS/TDAP  06/30/2025     Discussed health benefits of physical activity, and encouraged him to engage in regular exercise appropriate for his age and condition.    1. Annual physical exam Normal physical exam today. Will check labs as below and f/u pending lab results. If labs are stable and WNL he will not need to have these rechecked for one year at his next annual physical exam. He is to call the office in the meantime if she has any  acute issue, questions or concerns. - CBC w/Diff/Platelet - Comprehensive Metabolic Panel (CMET) - TSH - Lipid Profile - HgB A1c  2. BMI 27.0-27.9,adult Counseled patient on healthy lifestyle modifications including dieting and exercise.   3. Encounter for lipid screening for cardiovascular disease Will check labs as below and f/u pending results. - Lipid Profile  4. Diabetes mellitus screening Will check labs as below and f/u pending results. - HgB A1c  5. Varicose veins of both legs with edema Discussed wearing compression stockings since he works long hours standing on concrete the whole day. He is to call if symptoms worsen.  --------------------------------------------------------------------    Margaretann Loveless, PA-C  [facility]

## 2017-04-03 NOTE — Patient Instructions (Signed)

## 2017-04-04 LAB — COMPREHENSIVE METABOLIC PANEL
A/G RATIO: 1.6 (ref 1.2–2.2)
ALT: 22 IU/L (ref 0–44)
AST: 20 IU/L (ref 0–40)
Albumin: 4.6 g/dL (ref 3.5–5.5)
Alkaline Phosphatase: 61 IU/L (ref 39–117)
BUN/Creatinine Ratio: 14 (ref 9–20)
BUN: 14 mg/dL (ref 6–20)
Bilirubin Total: 0.9 mg/dL (ref 0.0–1.2)
CALCIUM: 9.8 mg/dL (ref 8.7–10.2)
CO2: 23 mmol/L (ref 20–29)
CREATININE: 0.97 mg/dL (ref 0.76–1.27)
Chloride: 105 mmol/L (ref 96–106)
GFR calc Af Amer: 114 mL/min/{1.73_m2} (ref >59)
GFR, EST NON AFRICAN AMERICAN: 99 mL/min/{1.73_m2} (ref >59)
Globulin, Total: 2.8 g/dL (ref 1.5–4.5)
Glucose: 68 mg/dL (ref 65–99)
POTASSIUM: 4.4 mmol/L (ref 3.5–5.2)
Sodium: 143 mmol/L (ref 134–144)
TOTAL PROTEIN: 7.4 g/dL (ref 6.0–8.5)

## 2017-04-04 LAB — CBC WITH DIFFERENTIAL/PLATELET
BASOS: 0 %
Basophils Absolute: 0 10*3/uL (ref 0.0–0.2)
EOS (ABSOLUTE): 0.2 10*3/uL (ref 0.0–0.4)
Eos: 2 %
HEMATOCRIT: 40.9 % (ref 37.5–51.0)
Hemoglobin: 13.6 g/dL (ref 13.0–17.7)
IMMATURE GRANS (ABS): 0.1 10*3/uL (ref 0.0–0.1)
IMMATURE GRANULOCYTES: 1 %
LYMPHS: 35 %
Lymphocytes Absolute: 3.2 10*3/uL — ABNORMAL HIGH (ref 0.7–3.1)
MCH: 26.1 pg — ABNORMAL LOW (ref 26.6–33.0)
MCHC: 33.3 g/dL (ref 31.5–35.7)
MCV: 79 fL (ref 79–97)
Monocytes Absolute: 0.6 10*3/uL (ref 0.1–0.9)
Monocytes: 7 %
NEUTROS PCT: 55 %
Neutrophils Absolute: 5.2 10*3/uL (ref 1.4–7.0)
PLATELETS: 369 10*3/uL (ref 150–379)
RBC: 5.21 x10E6/uL (ref 4.14–5.80)
RDW: 14.1 % (ref 12.3–15.4)
WBC: 9.3 10*3/uL (ref 3.4–10.8)

## 2017-04-04 LAB — LIPID PANEL
Chol/HDL Ratio: 2.1 ratio (ref 0.0–5.0)
Cholesterol, Total: 134 mg/dL (ref 100–199)
HDL: 63 mg/dL (ref >39)
LDL Calculated: 59 mg/dL (ref 0–99)
Triglycerides: 62 mg/dL (ref 0–149)
VLDL Cholesterol Cal: 12 mg/dL (ref 5–40)

## 2017-04-04 LAB — HEMOGLOBIN A1C
Est. average glucose Bld gHb Est-mCnc: 117 mg/dL
HEMOGLOBIN A1C: 5.7 % — AB (ref 4.8–5.6)

## 2017-04-04 LAB — TSH: TSH: 0.514 u[IU]/mL (ref 0.450–4.500)

## 2017-04-06 NOTE — Progress Notes (Signed)
Advised  ED 

## 2017-05-24 ENCOUNTER — Encounter: Payer: Self-pay | Admitting: Physician Assistant

## 2017-05-24 DIAGNOSIS — T782XXD Anaphylactic shock, unspecified, subsequent encounter: Secondary | ICD-10-CM

## 2017-05-25 MED ORDER — EPIPEN 2-PAK 0.3 MG/0.3ML IJ SOAJ: 0.3000 mg | Freq: Once | INTRAMUSCULAR | 3 refills | Status: AC

## 2017-06-18 ENCOUNTER — Telehealth: Payer: Self-pay

## 2017-06-18 NOTE — Telephone Encounter (Signed)
Pt states he was stung by a yellow jacket about 12 minutes prior to calling office. Denies anaphylactic sx, and states he has a numbness/tingling on the finger that was stung. He also admits to "disorientation". Advised pt go ER for evaluation.

## 2017-06-18 NOTE — Telephone Encounter (Signed)
Agree with plan and patient does have Rx for epipens to have on hand for when he gets stung.

## 2018-05-13 ENCOUNTER — Encounter: Payer: Self-pay | Admitting: Physician Assistant

## 2018-05-13 ENCOUNTER — Ambulatory Visit (INDEPENDENT_AMBULATORY_CARE_PROVIDER_SITE_OTHER): Payer: Managed Care, Other (non HMO) | Admitting: Physician Assistant

## 2018-05-13 VITALS — BP 100/60 | HR 75 | Temp 98.3°F | Resp 16 | Ht 69.0 in | Wt 205.2 lb

## 2018-05-13 DIAGNOSIS — Z13220 Encounter for screening for lipoid disorders: Secondary | ICD-10-CM | POA: Diagnosis not present

## 2018-05-13 DIAGNOSIS — Z0000 Encounter for general adult medical examination without abnormal findings: Secondary | ICD-10-CM | POA: Diagnosis not present

## 2018-05-13 DIAGNOSIS — Z131 Encounter for screening for diabetes mellitus: Secondary | ICD-10-CM | POA: Diagnosis not present

## 2018-05-13 DIAGNOSIS — Z136 Encounter for screening for cardiovascular disorders: Secondary | ICD-10-CM

## 2018-05-13 DIAGNOSIS — Z6830 Body mass index (BMI) 30.0-30.9, adult: Secondary | ICD-10-CM

## 2018-05-13 DIAGNOSIS — Z23 Encounter for immunization: Secondary | ICD-10-CM

## 2018-05-13 NOTE — Progress Notes (Signed)
Patient: Vincent Clark, Male    DOB: 1975-09-01, 42 y.o.   MRN: [id number] Visit Date: 05/13/2018  Today's Provider: Margaretann Loveless, PA-C   Chief Complaint  Patient presents with  . Annual Exam   Subjective:    Annual physical exam Vincent Clark is a 42 y.o. male who presents today for health maintenance and complete physical. He feels well. He reports exercising none, but does a lot at work. He reports he is sleeping well. -----------------------------------------------------------------  Review of Systems  Constitutional: Negative.   HENT: Positive for mouth sores.   Eyes: Negative.   Respiratory: Positive for shortness of breath.   Cardiovascular: Positive for chest pain.  Gastrointestinal: Negative.   Endocrine: Negative.   Genitourinary: Negative.   Musculoskeletal: Negative.   Skin: Negative.   Allergic/Immunologic: Negative.   Neurological: Positive for weakness, light-headedness and headaches.  Hematological: Negative.   Psychiatric/Behavioral: Positive for agitation.    Social History      He  reports that he has quit smoking. His smoking use included e-cigarettes. He has never used smokeless tobacco. He reports that he drinks about 8.0 standard drinks of alcohol per week. He reports that he does not use drugs.       Social History   Socioeconomic History  . Marital status: Married    Spouse name: Not on file  . Number of children: Not on file  . Years of education: Not on file  . Highest education level: Not on file  Occupational History  . Not on file  Social Needs  . Financial resource strain: Not on file  . Food insecurity:    Worry: Not on file    Inability: Not on file  . Transportation needs:    Medical: Not on file    Non-medical: Not on file  Tobacco Use  . Smoking status: Former Smoker    Types: E-cigarettes  . Smokeless tobacco: Never Used  . Tobacco comment: one month ago. 09/19/15  Substance and Sexual Activity  . Alcohol  use: Yes    Alcohol/week: 8.0 standard drinks    Types: 8 Shots of liquor per week    Comment: Occasional  . Drug use: No  . Sexual activity: Yes  Lifestyle  . Physical activity:    Days per week: Not on file    Minutes per session: Not on file  . Stress: Not on file  Relationships  . Social connections:    Talks on phone: Not on file    Gets together: Not on file    Attends religious service: Not on file    Active member of club or organization: Not on file    Attends meetings of clubs or organizations: Not on file    Relationship status: Not on file  Other Topics Concern  . Not on file  Social History Narrative  . Not on file    History reviewed. No pertinent past medical history.   Patient Active Problem List   Diagnosis Date Noted  . History of systemic reaction to bee sting 06/29/2015    Past Surgical History:  Procedure Laterality Date  . HERNIA REPAIR  2004    Family History        Family Status  Relation Name Status  . Mother  Alive  . Father  Alive  . Brother  Alive  . Mat Uncle 1 Alive  . [employer]  Alive  . [employer]  Deceased  . Mat Uncle 2  Alive        His family history includes Asthma in his brother; Bipolar disorder in his father and maternal uncle; Diabetes in his maternal grandmother and mother; Heart disease in his father; Mental illness in his maternal uncle.      Allergies  Allergen Reactions  . Other     Other reaction(s): Other (See Comments) Bees/wasp/hornets- swelling/cant breathe    No current outpatient medications on file.   Patient Care Team: Margaretann Loveless, PA-C as PCP - General (Family Medicine)      Objective:   Vitals: BP 100/60 (BP Location: Left Arm, Patient Position: Sitting, Cuff Size: Normal)   Pulse 75   Temp 98.3 F (36.8 C) (Oral)   Resp 16   Ht 5\' 9"  (1.753 m)   Wt 205 lb 3.2 oz (93.1 kg)   BMI 30.30 kg/m    Vitals:   05/13/18 1427  BP: 100/60  Pulse: 75  Resp: 16  Temp: 98.3 F (36.8 C)    TempSrc: Oral  Weight: 205 lb 3.2 oz (93.1 kg)  Height: 5\' 9"  (1.753 m)     Physical Exam  Constitutional: He is oriented to person, place, and time. He appears well-developed and well-nourished.  HENT:  Head: Normocephalic and atraumatic.  Right Ear: Hearing, tympanic membrane, external ear and ear canal normal.  Left Ear: Hearing, tympanic membrane, external ear and ear canal normal.  Nose: Nose normal.  Mouth/Throat: Uvula is midline, oropharynx is clear and moist and mucous membranes are normal.  Eyes: Pupils are equal, round, and reactive to light. Conjunctivae and EOM are normal. Right eye exhibits no discharge.  Neck: Normal range of motion. Neck supple. No tracheal deviation present. No thyromegaly present.  Cardiovascular: Normal rate, regular rhythm, normal heart sounds and intact distal pulses.  No murmur heard. Pulmonary/Chest: Effort normal and breath sounds normal. No respiratory distress. He has no wheezes. He has no rales. He exhibits no tenderness.  Abdominal: Soft. Bowel sounds are normal. He exhibits no distension and no mass. There is no tenderness. There is no rebound and no guarding.  Musculoskeletal: Normal range of motion. He exhibits no edema or tenderness.  Lymphadenopathy:    He has no cervical adenopathy.  Neurological: He is alert and oriented to person, place, and time. He has normal reflexes. He displays normal reflexes. No cranial nerve deficit. He exhibits normal muscle tone. Coordination normal.  Skin: Skin is warm and dry. No rash noted. No erythema.  Psychiatric: He has a normal mood and affect. His behavior is normal. Judgment and thought content normal.  Vitals reviewed.    Depression Screen PHQ 2/9 Scores 05/13/2018 04/03/2017 10/14/2016 03/31/2016  PHQ - 2 Score 0 1 0 0  PHQ- 9 Score 4 10 10  -      Assessment & Plan:     Routine Health Maintenance and Physical Exam  Exercise Activities and Dietary recommendations Goals   None      Immunization History  Administered Date(s) Administered  . Influenza,inj,Quad PF,6+ Mos 06/07/2015, 05/13/2018  . Tdap 07/01/2015    Health Maintenance  Topic Date Due  . HIV Screening  08/14/1993  . INFLUENZA VACCINE  03/18/2018  . TETANUS/TDAP  06/30/2025     Discussed health benefits of physical activity, and encouraged him to engage in regular exercise appropriate for his age and condition.    1. Annual physical exam Normal physical exam today. Will check labs as below and f/u pending lab results. If labs are stable  and WNL he will not need to have these rechecked for one year at his next annual physical exam. He is to call the office in the meantime if he has any acute issue, questions or concerns. - CBC with Differential/Platelet - Comprehensive metabolic panel - Hemoglobin A1c - TSH  2. Diabetes mellitus screening Will check labs as below and f/u pending results. - Hemoglobin A1c  3. Encounter for lipid screening for cardiovascular disease Will check labs as below and f/u pending results. - Lipid panel  4. BMI 30.0-30.9,adult Counseled patient on healthy lifestyle modifications including dieting and exercise.   5. Need for influenza vaccination Flu vaccine given today without complication. Patient sat upright for 15 minutes to check for adverse reaction before being released. - Flu Vaccine QUAD 36+ mos IM  --------------------------------------------------------------------    Margaretann Loveless, PA-C  [facility]

## 2018-05-13 NOTE — Patient Instructions (Signed)

## 2018-05-14 ENCOUNTER — Telehealth: Payer: Self-pay

## 2018-05-14 LAB — CBC WITH DIFFERENTIAL/PLATELET
BASOS ABS: 0.1 10*3/uL (ref 0.0–0.2)
Basos: 1 %
EOS (ABSOLUTE): 0.3 10*3/uL (ref 0.0–0.4)
Eos: 3 %
HEMOGLOBIN: 14.8 g/dL (ref 13.0–17.7)
Hematocrit: 44 % (ref 37.5–51.0)
Immature Grans (Abs): 0.1 10*3/uL (ref 0.0–0.1)
Immature Granulocytes: 1 %
LYMPHS: 31 %
Lymphocytes Absolute: 2.7 10*3/uL (ref 0.7–3.1)
MCH: 26.2 pg — AB (ref 26.6–33.0)
MCHC: 33.6 g/dL (ref 31.5–35.7)
MCV: 78 fL — ABNORMAL LOW (ref 79–97)
MONOCYTES: 7 %
Monocytes Absolute: 0.6 10*3/uL (ref 0.1–0.9)
NEUTROS ABS: 5 10*3/uL (ref 1.4–7.0)
Neutrophils: 57 %
Platelets: 313 10*3/uL (ref 150–450)
RBC: 5.65 x10E6/uL (ref 4.14–5.80)
RDW: 13.1 % (ref 12.3–15.4)
WBC: 8.7 10*3/uL (ref 3.4–10.8)

## 2018-05-14 LAB — LIPID PANEL
CHOL/HDL RATIO: 2.4 ratio (ref 0.0–5.0)
Cholesterol, Total: 164 mg/dL (ref 100–199)
HDL: 69 mg/dL (ref >39)
LDL Calculated: 77 mg/dL (ref 0–99)
Triglycerides: 91 mg/dL (ref 0–149)
VLDL CHOLESTEROL CAL: 18 mg/dL (ref 5–40)

## 2018-05-14 LAB — COMPREHENSIVE METABOLIC PANEL
A/G RATIO: 1.9 (ref 1.2–2.2)
ALBUMIN: 4.7 g/dL (ref 3.5–5.5)
ALK PHOS: 62 IU/L (ref 39–117)
ALT: 22 IU/L (ref 0–44)
AST: 20 IU/L (ref 0–40)
BUN / CREAT RATIO: 8 — AB (ref 9–20)
BUN: 8 mg/dL (ref 6–20)
Bilirubin Total: 0.8 mg/dL (ref 0.0–1.2)
CO2: 21 mmol/L (ref 20–29)
Calcium: 9.9 mg/dL (ref 8.7–10.2)
Chloride: 102 mmol/L (ref 96–106)
Creatinine, Ser: 0.99 mg/dL (ref 0.76–1.27)
GFR calc Af Amer: 110 mL/min/{1.73_m2} (ref >59)
GFR, EST NON AFRICAN AMERICAN: 96 mL/min/{1.73_m2} (ref >59)
GLUCOSE: 83 mg/dL (ref 65–99)
Globulin, Total: 2.5 g/dL (ref 1.5–4.5)
POTASSIUM: 4.4 mmol/L (ref 3.5–5.2)
SODIUM: 137 mmol/L (ref 134–144)
Total Protein: 7.2 g/dL (ref 6.0–8.5)

## 2018-05-14 LAB — HEMOGLOBIN A1C
Est. average glucose Bld gHb Est-mCnc: 120 mg/dL
HEMOGLOBIN A1C: 5.8 % — AB (ref 4.8–5.6)

## 2018-05-14 LAB — TSH: TSH: 0.519 u[IU]/mL (ref 0.450–4.500)

## 2018-05-14 NOTE — Telephone Encounter (Signed)
-----   Message from Margaretann Loveless, [place] sent at 05/14/2018  1:27 PM EDT ----- All labs are within normal limits and stable.  Thanks! -JB

## 2018-05-14 NOTE — Telephone Encounter (Signed)
Patient advised as directed below.  Thanks,  -Joseline 

## 2018-06-02 ENCOUNTER — Ambulatory Visit (INDEPENDENT_AMBULATORY_CARE_PROVIDER_SITE_OTHER): Payer: Managed Care, Other (non HMO) | Admitting: Physician Assistant

## 2018-06-02 ENCOUNTER — Encounter: Payer: Self-pay | Admitting: Physician Assistant

## 2018-06-02 VITALS — BP 120/78 | HR 64 | Temp 98.8°F | Resp 16 | Wt 204.2 lb

## 2018-06-02 DIAGNOSIS — I83812 Varicose veins of left lower extremities with pain: Secondary | ICD-10-CM | POA: Diagnosis not present

## 2018-06-02 DIAGNOSIS — R1031 Right lower quadrant pain: Secondary | ICD-10-CM | POA: Diagnosis not present

## 2018-06-02 DIAGNOSIS — K4091 Unilateral inguinal hernia, without obstruction or gangrene, recurrent: Secondary | ICD-10-CM

## 2018-06-02 DIAGNOSIS — I83892 Varicose veins of left lower extremities with other complications: Secondary | ICD-10-CM | POA: Diagnosis not present

## 2018-06-02 NOTE — Progress Notes (Signed)
Patient: Vincent Clark Male    DOB: [birth year]   42 y.o.   MRN: [id number] Visit Date: 06/02/2018  Today's Provider: Margaretann Loveless, PA-C   Chief Complaint  Patient presents with  . Leg Swelling   Subjective:    HPI Patient here with c/o veins on left leg are hurting him with on and off swelling for the past few month. Worsening for the past 6 weeks ago. Patient has tried IBU for the pain and compression socks. Does stand on concrete and walk on concrete for more than 10 hours per day.   Patient is also having sharp pains on the right side where he had the hernia repair. Reports that this has been going on for a month or so. Reports sometimes the pain is so sharp he doubles over. Denies fever, nausea, vomiting or bowel changes.    Allergies  Allergen Reactions  . Other     Other reaction(s): Other (See Comments) Bees/wasp/hornets- swelling/cant breathe    No current outpatient medications on file.  Review of Systems  Constitutional: Negative.   Respiratory: Negative.   Cardiovascular: Positive for leg swelling ("off and on"). Negative for chest pain and palpitations.  Gastrointestinal: Positive for abdominal pain. Negative for abdominal distention, blood in stool, constipation, diarrhea, nausea and vomiting.  Genitourinary: Negative.   Musculoskeletal: Negative for back pain.  Neurological: Negative.     Social History   Tobacco Use  . Smoking status: Former Smoker    Types: E-cigarettes  . Smokeless tobacco: Never Used  . Tobacco comment: one month ago. 09/19/15  Substance Use Topics  . Alcohol use: Yes    Alcohol/week: 8.0 standard drinks    Types: 8 Shots of liquor per week    Comment: Occasional   Objective:   BP 120/78 (BP Location: Left Arm, Patient Position: Sitting, Cuff Size: Normal)   Pulse 64   Temp 98.8 F (37.1 C) (Oral)   Resp 16   Wt 204 lb 3.2 oz (92.6 kg)   SpO2 96%   BMI 30.16 kg/m  Vitals:   06/02/18 1452  BP: 120/78    Pulse: 64  Resp: 16  Temp: 98.8 F (37.1 C)  TempSrc: Oral  SpO2: 96%  Weight: 204 lb 3.2 oz (92.6 kg)     Physical Exam  Constitutional: He appears well-developed and well-nourished. No distress.  HENT:  Head: Normocephalic and atraumatic.  Neck: Normal range of motion. Neck supple.  Cardiovascular: Normal rate, regular rhythm and normal heart sounds. Exam reveals no gallop and no friction rub.  No murmur heard. Pulmonary/Chest: Effort normal and breath sounds normal. No respiratory distress. He has no wheezes. He has no rales.  Abdominal: Soft. Bowel sounds are normal. He exhibits no distension. There is tenderness. There is guarding. There is no rebound. A hernia is present.    Skin: He is not diaphoretic.     Vitals reviewed.       Assessment & Plan:     1. Varicose veins of left lower extremity with pain Will refer to vein clinic as below for further evaluation and treatment considerations. Continue compression stockings, elevating legs when at rest and IBU prn for pain.  - Ambulatory referral to Vascular Surgery  2. Varicose veins of left leg with edema See above medical treatment plan. - Ambulatory referral to Vascular Surgery  3. Unilateral recurrent inguinal hernia without obstruction or gangrene Suspect recurrent right inguinal hernia vs new hernia around mesh. DDx also  includes appendicitis due to location of pain, but less likely due to how long this has been bothering him. Will get CT as below for further evaluation of the area and see if referral to general surgery is required.  - CT ABDOMEN PELVIS W CONTRAST; Future  4. RLQ abdominal pain See above medical treatment plan. - CT ABDOMEN PELVIS W CONTRAST; Future       Margaretann Loveless, PA-C  [facility]

## 2018-06-02 NOTE — Patient Instructions (Signed)
Varicose Veins Varicose veins are veins that have become enlarged and twisted. They are usually seen in the legs but can occur in other parts of the body as well. What are the causes? This condition is the result of valves in the veins not working properly. Valves in the veins help to return blood from the leg to the heart. If these valves are damaged, blood flows backward and backs up into the veins in the leg near the skin. This causes the veins to become larger. What increases the risk? People who are on their feet a lot, who are pregnant, or who are overweight are more likely to develop varicose veins. What are the signs or symptoms?  Bulging, twisted-appearing, bluish veins, most commonly found on the legs.  Leg pain or a feeling of heaviness. These symptoms may be worse at the end of the day.  Leg swelling.  Changes in skin color. How is this diagnosed? A health care provider can usually diagnose varicose veins by examining your legs. Your health care provider may also recommend an ultrasound of your leg veins. How is this treated? Most varicose veins can be treated at home.However, other treatments are available for people who have persistent symptoms or want to improve the cosmetic appearance of the varicose veins. These treatment options include:  Sclerotherapy. A solution is injected into the vein to close it off.  Laser treatment. A laser is used to heat the vein to close it off.  Radiofrequency vein ablation. An electrical current produced by radio waves is used to close off the vein.  Phlebectomy. The vein is surgically removed through small incisions made over the varicose vein.  Vein ligation and stripping. The vein is surgically removed through incisions made over the varicose vein after the vein has been tied (ligated). Follow these instructions at home:   Do not stand or sit in one position for long periods of time. Do not sit with your legs crossed. Rest with your  legs raised during the day.  Wear compression stockings as directed by your health care provider. These stockings help to prevent blood clots and reduce swelling in your legs.  Do not wear other tight, encircling garments around your legs, pelvis, or waist.  Walk as much as possible to increase blood flow.  Raise the foot of your bed at night with 2-inch blocks.  If you get a cut in the skin over the vein and the vein bleeds, lie down with your leg raised and press on it with a clean cloth until the bleeding stops. Then place a bandage (dressing) on the cut. See your health care provider if it continues to bleed. Contact a health care provider if:  The skin around your ankle starts to break down.  You have pain, redness, tenderness, or hard swelling in your leg over a vein.  You are uncomfortable because of leg pain. This information is not intended to replace advice given to you by your health care provider. Make sure you discuss any questions you have with your health care provider. Document Released: 05/14/2005 Document Revised: 01/10/2016 Document Reviewed: 02/05/2016 Elsevier Interactive Patient Education  2017 [employer] Chronic Venous Insufficiency Chronic venous insufficiency, also called venous stasis, is a condition that prevents blood from being pumped effectively through the veins in your legs. Blood may no longer be pumped effectively from the legs back to the heart. This condition can range from mild to severe. With proper treatment, you should be able to continue   with an active life. What are the causes? Chronic venous insufficiency occurs when the vein walls become stretched, weakened, or damaged, or when valves within the vein are damaged. Some common causes of this include:  High blood pressure inside the veins (venous hypertension).  Increased blood pressure in the leg veins from long periods of sitting or standing.  A blood clot that blocks blood flow in a vein  (deep vein thrombosis, DVT).  Inflammation of a vein (phlebitis) that causes a blood clot to form.  Tumors in the pelvis that cause blood to back up. What increases the risk? The following factors may make you more likely to develop this condition:  Having a family history of this condition.  Obesity.  Pregnancy.  Living without enough physical activity or exercise (sedentary lifestyle).  Smoking.  Having a job that requires long periods of standing or sitting in one place.  Being a certain age. Women in their 40s and 50s and men in their 70s are more likely to develop this condition. What are the signs or symptoms? Symptoms of this condition include:  Veins that are enlarged, bulging, or twisted (varicose veins).  Skin breakdown or ulcers.  Reddened or discolored skin on the front of the leg.  Brown, smooth, tight, and painful skin just above the ankle, usually on the inside of the leg (lipodermatosclerosis).  Swelling. How is this diagnosed? This condition may be diagnosed based on:  Your medical history.  A physical exam.  Tests, such as:  A procedure that creates an image of a blood vessel and nearby organs and provides information about blood flow through the blood vessel (duplex ultrasound).  A procedure that tests blood flow (plethysmography).  A procedure to look at the veins using X-ray and dye (venogram). How is this treated? The goals of treatment are to help you return to an active life and to minimize pain or disability. Treatment depends on the severity of your condition, and it may include:  Wearing compression stockings. These can help relieve symptoms and help prevent your condition from getting worse. However, they do not cure the condition.  Sclerotherapy. This is a procedure involving an injection of a material that "dissolves" damaged veins.  Surgery. This may involve:  Removing a diseased vein (vein stripping).  Cutting off blood flow  through the vein (laser ablation surgery).  Repairing a valve. Follow these instructions at home:  Wear compression stockings as told by your health care provider. These stockings help to prevent blood clots and reduce swelling in your legs.  Take over-the-counter and prescription medicines only as told by your health care provider.  Stay active by exercising, walking, or doing different activities. Ask your health care provider what activities are safe for you and how much exercise you need.  Drink enough fluid to keep your urine clear or pale yellow.  Do not use any products that contain nicotine or tobacco, such as cigarettes and e-cigarettes. If you need help quitting, ask your health care provider.  Keep all follow-up visits as told by your health care provider. This is important. Contact a health care provider if:  You have redness, swelling, or more pain in the affected area.  You see a red streak or line that extends up or down from the affected area.  You have skin breakdown or a loss of skin in the affected area, even if the breakdown is small.  You get an injury in the affected area. Get help right away if:    You get an injury and an open wound in the affected area.  You have severe pain that does not get better with medicine.  You have sudden numbness or weakness in the foot or ankle below the affected area, or you have trouble moving your foot or ankle.  You have a fever and you have worse or persistent symptoms.  You have chest pain.  You have shortness of breath. Summary  Chronic venous insufficiency, also called venous stasis, is a condition that prevents blood from being pumped effectively through the veins in your legs.  Chronic venous insufficiency occurs when the vein walls become stretched, weakened, or damaged, or when valves within the vein are damaged.  Treatment for this condition depends on how severe your condition is, and it may involve wearing  compression stockings or having a procedure.  Make sure you stay active by exercising, walking, or doing different activities. Ask your health care provider what activities are safe for you and how much exercise you need. This information is not intended to replace advice given to you by your health care provider. Make sure you discuss any questions you have with your health care provider. Document Released: 12/08/2006 Document Revised: 06/23/2016 Document Reviewed: 06/23/2016 Elsevier Interactive Patient Education  2017 [employer]  

## 2018-06-11 ENCOUNTER — Ambulatory Visit
Admission: RE | Admit: 2018-06-11 | Discharge: 2018-06-11 | Disposition: A | Discharge status: Home / Self Care | Payer: Managed Care, Other (non HMO) | Source: Ambulatory Visit | Attending: Physician Assistant | Admitting: Physician Assistant

## 2018-06-11 DIAGNOSIS — Z9889 Other specified postprocedural states: Secondary | ICD-10-CM | POA: Insufficient documentation

## 2018-06-11 DIAGNOSIS — K4091 Unilateral inguinal hernia, without obstruction or gangrene, recurrent: Secondary | ICD-10-CM

## 2018-06-11 DIAGNOSIS — R1031 Right lower quadrant pain: Secondary | ICD-10-CM | POA: Insufficient documentation

## 2018-06-11 MED ORDER — IOPAMIDOL (ISOVUE-300) INJECTION 61%
100.0000 mL | Freq: Once | INTRAVENOUS | Status: AC | PRN
  Administered 2018-06-11: 100 mL via INTRAVENOUS

## 2018-06-14 ENCOUNTER — Telehealth: Payer: Self-pay

## 2018-06-14 NOTE — Telephone Encounter (Signed)
-----   Message from Malva Limes, MD sent at 06/14/2018 11:27 AM EDT ----- CT is completely normal. No explanation for abdominal pain.  Might be related to scar tissue or inflammation for previous prescription. Can try naproxen 500mg  one to two times a day, #30, rf x 3. If that doesn't help then may need to follow up with surgeon.

## 2018-06-14 NOTE — Telephone Encounter (Signed)
LVMTRC.,PC 

## 2018-06-15 NOTE — Telephone Encounter (Signed)
Advised pt of CT results and to take naproxen.  Pt stated that he doesn't want any medication (naproxen) that he will take some ibuprofen if needed.  dbs

## 2019-04-06 NOTE — Progress Notes (Signed)
       Patient: Vincent Clark Male    DOB: 10/21/1975   43 y.o.   MRN: [id number] Visit Date: 04/06/2019  Today's Provider: Mar Daring, PA-C   No chief complaint on file.  Subjective:     HPI   Azavion Bouillon is a 43 yr old male that presents via video visit for poison oak dermatitis. 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 He reports he has been trying to stay covered but must have gotten into some plant that has caused a rash. He reports it is on his thighs, trunk, arms, and had been under his right eye, but that has improved.   Allergies  Allergen Reactions  . Other     Other reaction(s): Other (See Comments) Bees/wasp/hornets- swelling/cant breathe    No current outpatient medications on file.  Review of Systems  Constitutional: Negative.   Respiratory: Negative.   Cardiovascular: Negative.   Skin: Positive for rash.  Neurological: Negative.     Social History   Tobacco Use  . Smoking status: Former Smoker    Types: E-cigarettes  . Smokeless tobacco: Never Used  . Tobacco comment: one month ago. 09/19/15  Substance Use Topics  . Alcohol use: Yes    Alcohol/week: 8.0 standard drinks    Types: 8 Shots of liquor per week    Comment: Occasional      Objective:   There were no vitals taken for this visit. There were no vitals filed for this visit.   Physical Exam Vitals signs reviewed.  Constitutional:      General: He is not in acute distress.    Appearance: Normal appearance. He is well-developed.  HENT:     Head: Normocephalic and atraumatic.  Eyes:     Conjunctiva/sclera: Conjunctivae normal.  Neck:     Musculoskeletal: Normal range of motion and neck supple.  Pulmonary:     Effort: Pulmonary effort is normal. No respiratory distress.  Skin:    Comments: Papular rash with scabbing and excoriation marks on arms  Neurological:     Mental Status: He is alert.  Psychiatric:        Mood and Affect: Mood normal.      Behavior: Behavior normal.        Thought Content: Thought content normal.        Judgment: Judgment normal.     No results found for any visits on 04/07/19.     Assessment & Plan    1. Contact dermatitis due to poison oak Will treat with prednisone and triamcinolone as below. Lukewarm showers. Try to avoid scratching to prevent spread. Call if worsening.  - predniSONE (STERAPRED UNI-PAK 21 TAB) 10 MG (21) TBPK tablet; 6 day taper; take as directed on package instructions  Dispense: 21 tablet; Refill: 0 - triamcinolone cream (KENALOG) 0.1 %; Apply 1 application topically 2 (two) times daily.  Dispense: 80 g; Refill: 0     Mar Daring, PA-C  [facility]

## 2019-04-07 ENCOUNTER — Other Ambulatory Visit: Payer: Self-pay

## 2019-04-07 ENCOUNTER — Encounter: Payer: Self-pay | Admitting: Physician Assistant

## 2019-04-07 ENCOUNTER — Telehealth (INDEPENDENT_AMBULATORY_CARE_PROVIDER_SITE_OTHER): Payer: Managed Care, Other (non HMO) | Admitting: Physician Assistant

## 2019-04-07 DIAGNOSIS — L237 Allergic contact dermatitis due to plants, except food: Secondary | ICD-10-CM | POA: Diagnosis not present

## 2019-04-07 MED ORDER — TRIAMCINOLONE ACETONIDE 0.1 % EX CREA: 1.0000 "application " | TOPICAL_CREAM | Freq: Two times a day (BID) | CUTANEOUS | 0 refills | Status: DC

## 2019-04-07 MED ORDER — PREDNISONE 10 MG (21) PO TBPK: ORAL_TABLET | ORAL | 0 refills | Status: DC

## 2019-04-07 NOTE — Patient Instructions (Signed)
Poison Oak Dermatitis  Poison oak dermatitis is redness and soreness (inflammation) of the skin caused by chemicals in the leaves of the poison oak plant. You may have very bad itching, swelling, a rash, and blisters. What are the causes? You may get this condition by:  Touching a poison oak plant.  Touching something that has the chemical from the leaves on it. This may include animals or objects that have come in contact with the plant. What increases the risk? You are more likely to get this condition if you:  Go outdoors often in wooded or marshy areas.  Go outdoors without wearing protective clothing, such as closed shoes, long pants, and a long-sleeved shirt. What are the signs or symptoms? Symptoms of this condition include:  Redness of the skin.  Very bad itching.  A rash that often includes bumps and blisters. ? The rash usually appears 48 hours after exposure if you have been exposed before. ? If this is the first time you have been exposed, the rash may not appear until a week after exposure.  Swelling. This may occur if the reaction is very bad. Symptoms often clear up in 1-2 weeks. The first time you develop this condition, symptoms may last 3-4 weeks. How is this treated? This condition may be treated with:  Hydrocortisone creams or calamine lotions to help with itching.  Oatmeal baths to soothe the skin.  Medicines to help reduce itching (antihistamines). If you have a very bad reaction, you may also be given steroid medicines. Follow these instructions at home: Medicines  Take or apply over-the-counter and prescription medicines only as told by your doctor.  Use hydrocortisone creams or calamine lotion as needed to help with itching. General instructions  Do not scratch or rub your skin.  Put a cold, wet cloth (cold compress) on the affected areas or take baths in cool water. This will help with itching.  Avoid hot baths and showers.  Take oatmeal  baths as needed. Use colloidal oatmeal. You can get this at a pharmacy or grocery store. Follow the instructions on the package.  While you have the rash, wash your clothes right after you wear them.  Keep all follow-up visits as told by your doctor. This is important. How is this prevented?   Know what poison oak looks like so you can avoid it. ? This plant has three leaves with flowering branches on a single stem. ? The leaves are fuzzy. ? The edges of the leaves look like teeth.  If you have touched poison oak, wash your skin with soap and water right away. Be sure to wash under your fingernails.  When hiking or camping, wear long pants, a long-sleeved shirt, tall socks, and hiking boots. You can also use a lotion on your skin that helps to prevent contact with the chemical on the plant.  If you think that your clothes or outdoor gear came in contact with poison oak, rinse them off with a garden hose before you bring them inside your house.  When doing yard work or gardening, wear gloves, long sleeves, long pants, and boots. Wash your garden tools and gloves if they come in contact with poison oak.  If you think that your pet has come into contact with poison oak, wash him or her with pet shampoo and water. Make sure you wear gloves while washing your pet.  Do not burn poison oak plants. This can release the chemical from the plant into the air and   may cause a reaction. Contact a doctor if:  You have open sores in the rash area.  You have more redness, swelling, or pain in the affected area.  You have redness that spreads beyond the rash area.  You have fluid, blood, or pus coming from the affected area.  You have a fever.  You have a rash over a large area of your body.  You have a rash on your eyes, mouth, or genitals.  Your rash does not improve after a few weeks. Get help right away if:  Your face swells or your eyes swell shut.  You have trouble breathing.  You  have trouble swallowing. These symptoms may be an emergency. Do not wait to see if the symptoms will go away. Get medical help right away. Call your local emergency services (911 in the [place] Do not drive yourself to the hospital. Summary  Poison oak dermatitis is redness and soreness of the skin caused by chemicals in the leaves of the poison oak plant.  Symptoms of this condition include redness, very bad itching, a rash, and swelling.  Do not scratch or rub your skin.  Take or apply over-the-counter and prescription medicines only as told by your doctor. This information is not intended to replace advice given to you by your health care provider. Make sure you discuss any questions you have with your health care provider. Document Released: 09/06/2010 Document Revised: 11/26/2018 Document Reviewed: 09/03/2018 Elsevier Patient Education  2020 [employer]  

## 2019-07-01 ENCOUNTER — Encounter: Payer: Managed Care, Other (non HMO) | Admitting: Physician Assistant

## 2019-08-29 ENCOUNTER — Other Ambulatory Visit: Payer: Self-pay

## 2019-08-29 ENCOUNTER — Encounter: Payer: Self-pay | Admitting: Physician Assistant

## 2019-08-29 ENCOUNTER — Ambulatory Visit (INDEPENDENT_AMBULATORY_CARE_PROVIDER_SITE_OTHER): Payer: Managed Care, Other (non HMO) | Admitting: Physician Assistant

## 2019-08-29 VITALS — BP 116/77 | HR 49 | Temp 97.3°F | Resp 16 | Ht 69.0 in | Wt 203.0 lb

## 2019-08-29 DIAGNOSIS — Z125 Encounter for screening for malignant neoplasm of prostate: Secondary | ICD-10-CM | POA: Diagnosis not present

## 2019-08-29 DIAGNOSIS — Z23 Encounter for immunization: Secondary | ICD-10-CM | POA: Diagnosis not present

## 2019-08-29 DIAGNOSIS — Z13220 Encounter for screening for lipoid disorders: Secondary | ICD-10-CM

## 2019-08-29 DIAGNOSIS — R079 Chest pain, unspecified: Secondary | ICD-10-CM

## 2019-08-29 DIAGNOSIS — Z0000 Encounter for general adult medical examination without abnormal findings: Secondary | ICD-10-CM | POA: Diagnosis not present

## 2019-08-29 DIAGNOSIS — M722 Plantar fascial fibromatosis: Secondary | ICD-10-CM

## 2019-08-29 DIAGNOSIS — Z6829 Body mass index (BMI) 29.0-29.9, adult: Secondary | ICD-10-CM

## 2019-08-29 DIAGNOSIS — Z136 Encounter for screening for cardiovascular disorders: Secondary | ICD-10-CM

## 2019-08-29 DIAGNOSIS — Z131 Encounter for screening for diabetes mellitus: Secondary | ICD-10-CM

## 2019-08-29 DIAGNOSIS — R001 Bradycardia, unspecified: Secondary | ICD-10-CM

## 2019-08-29 NOTE — Patient Instructions (Signed)
Plantar Fasciitis  Plantar fasciitis is a painful foot condition that affects the heel. It occurs when the band of tissue that connects the toes to the heel bone (plantar fascia) becomes irritated. This can happen as the result of exercising too much or doing other repetitive activities (overuse injury). The pain from plantar fasciitis can range from mild irritation to severe pain that makes it difficult to walk or move. The pain is usually worse in the morning after sleeping, or after sitting or lying down for a while. Pain may also be worse after long periods of walking or standing. What are the causes? This condition may be caused by:  Standing for long periods of time.  Wearing shoes that do not have good arch support.  Doing activities that put stress on joints (high-impact activities), including running, aerobics, and ballet.  Being overweight.  An abnormal way of walking (gait).  Tight muscles in the back of your lower leg (calf).  High arches in your feet.  Starting a new athletic activity. What are the signs or symptoms? The main symptom of this condition is heel pain. Pain may:  Be worse with first steps after a time of rest, especially in the morning after sleeping or after you have been sitting or lying down for a while.  Be worse after long periods of standing still.  Decrease after 30-45 minutes of activity, such as gentle walking. How is this diagnosed? This condition may be diagnosed based on your medical history and your symptoms. Your health care provider may ask questions about your activity level. Your health care provider will do a physical exam to check for:  A tender area on the bottom of your foot.  A high arch in your foot.  Pain when you move your foot.  Difficulty moving your foot. You may have imaging tests to confirm the diagnosis, such as:  X-rays.  Ultrasound.  MRI. How is this treated? Treatment for plantar fasciitis depends on how  severe your condition is. Treatment may include:  Rest, ice, applying pressure (compression), and raising the affected foot (elevation). This may be called RICE therapy. Your health care provider may recommend RICE therapy along with over-the-counter pain medicines to manage your pain.  Exercises to stretch your calves and your plantar fascia.  A splint that holds your foot in a stretched, upward position while you sleep (night splint).  Physical therapy to relieve symptoms and prevent problems in the future.  Injections of steroid medicine (cortisone) to relieve pain and inflammation.  Stimulating your plantar fascia with electrical impulses (extracorporeal shock wave therapy). This is usually the last treatment option before surgery.  Surgery, if other treatments have not worked after 12 months. Follow these instructions at home:  Managing pain, stiffness, and swelling  If directed, put ice on the painful area: ? Put ice in a plastic bag, or use a frozen bottle of water. ? Place a towel between your skin and the bag or bottle. ? Roll the bottom of your foot over the bag or bottle. ? Do this for 20 minutes, 2-3 times a day.  Wear athletic shoes that have air-sole or gel-sole cushions, or try wearing soft shoe inserts that are designed for plantar fasciitis.  Raise (elevate) your foot above the level of your heart while you are sitting or lying down. Activity  Avoid activities that cause pain. Ask your health care provider what activities are safe for you.  Do physical therapy exercises and stretches as told   by your health care provider.  Try activities and forms of exercise that are easier on your joints (low-impact). Examples include swimming, water aerobics, and biking. General instructions  Take over-the-counter and prescription medicines only as told by your health care provider.  Wear a night splint while sleeping, if told by your health care provider. Loosen the splint  if your toes tingle, become numb, or turn cold and blue.  Maintain a healthy weight, or work with your health care provider to lose weight as needed.  Keep all follow-up visits as told by your health care provider. This is important. Contact a health care provider if you:  Have symptoms that do not go away after caring for yourself at home.  Have pain that gets worse.  Have pain that affects your ability to move or do your daily activities. Summary  Plantar fasciitis is a painful foot condition that affects the heel. It occurs when the band of tissue that connects the toes to the heel bone (plantar fascia) becomes irritated.  The main symptom of this condition is heel pain that may be worse after exercising too much or standing still for a long time.  Treatment varies, but it usually starts with rest, ice, compression, and elevation (RICE therapy) and over-the-counter medicines to manage pain. This information is not intended to replace advice given to you by your health care provider. Make sure you discuss any questions you have with your health care provider. Document Revised: 07/17/2017 Document Reviewed: 06/01/2017 [employer] Patient Education  2020 [employer]   Shoulder Exercises Ask your health care provider which exercises are safe for you. Do exercises exactly as told by your health care provider and adjust them as directed. It is normal to feel mild stretching, pulling, tightness, or discomfort as you do these exercises. Stop right away if you feel sudden pain or your pain gets worse. Do not begin these exercises until told by your health care provider. Stretching exercises External rotation and abduction This exercise is sometimes called corner stretch. This exercise rotates your arm outward (external rotation) and moves your arm out from your body (abduction). 1. Stand in a doorway with one of your feet slightly in front of the other. This is called a staggered stance. If  you cannot reach your forearms to the door frame, stand facing a corner of a room. 2. Choose one of the following positions as told by your health care provider: ? Place your hands and forearms on the door frame above your head. ? Place your hands and forearms on the door frame at the height of your head. ? Place your hands on the door frame at the height of your elbows. 3. Slowly move your weight onto your front foot until you feel a stretch across your chest and in the front of your shoulders. Keep your head and chest upright and keep your abdominal muscles tight. 4. Hold for __________ seconds. 5. To release the stretch, shift your weight to your back foot. Repeat __________ times. Complete this exercise __________ times a day. Extension, standing 1. Stand and hold a broomstick, a cane, or a similar object behind your back. ? Your hands should be a little wider than shoulder width apart. ? Your palms should face away from your back. 2. Keeping your elbows straight and your shoulder muscles relaxed, move the stick away from your body until you feel a stretch in your shoulders (extension). ? Avoid shrugging your shoulders while you move the stick. Keep  your shoulder blades tucked down toward the middle of your back. 3. Hold for __________ seconds. 4. Slowly return to the starting position. Repeat __________ times. Complete this exercise __________ times a day. Range-of-motion exercises Pendulum  1. Stand near a wall or a surface that you can hold onto for balance. 2. Bend at the waist and let your left / right arm hang straight down. Use your other arm to support you. Keep your back straight and do not lock your knees. 3. Relax your left / right arm and shoulder muscles, and move your hips and your trunk so your left / right arm swings freely. Your arm should swing because of the motion of your body, not because you are using your arm or shoulder muscles. 4. Keep moving your hips and trunk  so your arm swings in the following directions, as told by your health care provider: ? Side to side. ? Forward and backward. ? In clockwise and counterclockwise circles. 5. Continue each motion for __________ seconds, or for as long as told by your health care provider. 6. Slowly return to the starting position. Repeat __________ times. Complete this exercise __________ times a day. Shoulder flexion, standing  1. Stand and hold a broomstick, a cane, or a similar object. Place your hands a little more than shoulder width apart on the object. Your left / right hand should be palm up, and your other hand should be palm down. 2. Keep your elbow straight and your shoulder muscles relaxed. Push the stick up with your healthy arm to raise your left / right arm in front of your body, and then over your head until you feel a stretch in your shoulder (flexion). ? Avoid shrugging your shoulder while you raise your arm. Keep your shoulder blade tucked down toward the middle of your back. 3. Hold for __________ seconds. 4. Slowly return to the starting position. Repeat __________ times. Complete this exercise __________ times a day. Shoulder abduction, standing 1. Stand and hold a broomstick, a cane, or a similar object. Place your hands a little more than shoulder width apart on the object. Your left / right hand should be palm up, and your other hand should be palm down. 2. Keep your elbow straight and your shoulder muscles relaxed. Push the object across your body toward your left / right side. Raise your left / right arm to the side of your body (abduction) until you feel a stretch in your shoulder. ? Do not raise your arm above shoulder height unless your health care provider tells you to do that. ? If directed, raise your arm over your head. ? Avoid shrugging your shoulder while you raise your arm. Keep your shoulder blade tucked down toward the middle of your back. 3. Hold for __________  seconds. 4. Slowly return to the starting position. Repeat __________ times. Complete this exercise __________ times a day. Internal rotation  1. Place your left / right hand behind your back, palm up. 2. Use your other hand to dangle an exercise band, a towel, or a similar object over your shoulder. Grasp the band with your left / right hand so you are holding on to both ends. 3. Gently pull up on the band until you feel a stretch in the front of your left / right shoulder. The movement of your arm toward the center of your body is called internal rotation. ? Avoid shrugging your shoulder while you raise your arm. Keep your shoulder blade tucked down  toward the middle of your back. 4. Hold for __________ seconds. 5. Release the stretch by letting go of the band and lowering your hands. Repeat __________ times. Complete this exercise __________ times a day. Strengthening exercises External rotation  1. Sit in a stable chair without armrests. 2. Secure an exercise band to a stable object at elbow height on your left / right side. 3. Place a soft object, such as a folded towel or a small pillow, between your left / right upper arm and your body to move your elbow about 4 inches (10 cm) away from your side. 4. Hold the end of the exercise band so it is tight and there is no slack. 5. Keeping your elbow pressed against the soft object, slowly move your forearm out, away from your abdomen (external rotation). Keep your body steady so only your forearm moves. 6. Hold for __________ seconds. 7. Slowly return to the starting position. Repeat __________ times. Complete this exercise __________ times a day. Shoulder abduction  1. Sit in a stable chair without armrests, or stand up. 2. Hold a __________ weight in your left / right hand, or hold an exercise band with both hands. 3. Start with your arms straight down and your left / right palm facing in, toward your body. 4. Slowly lift your left /  right hand out to your side (abduction). Do not lift your hand above shoulder height unless your health care provider tells you that this is safe. ? Keep your arms straight. ? Avoid shrugging your shoulder while you do this movement. Keep your shoulder blade tucked down toward the middle of your back. 5. Hold for __________ seconds. 6. Slowly lower your arm, and return to the starting position. Repeat __________ times. Complete this exercise __________ times a day. Shoulder extension 1. Sit in a stable chair without armrests, or stand up. 2. Secure an exercise band to a stable object in front of you so it is at shoulder height. 3. Hold one end of the exercise band in each hand. Your palms should face each other. 4. Straighten your elbows and lift your hands up to shoulder height. 5. Step back, away from the secured end of the exercise band, until the band is tight and there is no slack. 6. Squeeze your shoulder blades together as you pull your hands down to the sides of your thighs (extension). Stop when your hands are straight down by your sides. Do not let your hands go behind your body. 7. Hold for __________ seconds. 8. Slowly return to the starting position. Repeat __________ times. Complete this exercise __________ times a day. Shoulder row 1. Sit in a stable chair without armrests, or stand up. 2. Secure an exercise band to a stable object in front of you so it is at waist height. 3. Hold one end of the exercise band in each hand. Position your palms so that your thumbs are facing the ceiling (neutral position). 4. Bend each of your elbows to a 90-degree angle (right angle) and keep your upper arms at your sides. 5. Step back until the band is tight and there is no slack. 6. Slowly pull your elbows back behind you. 7. Hold for __________ seconds. 8. Slowly return to the starting position. Repeat __________ times. Complete this exercise __________ times a day. Shoulder  press-ups  1. Sit in a stable chair that has armrests. Sit upright, with your feet flat on the floor. 2. Put your hands on the armrests so  your elbows are bent and your fingers are pointing forward. Your hands should be about even with the sides of your body. 3. Push down on the armrests and use your arms to lift yourself off the chair. Straighten your elbows and lift yourself up as much as you comfortably can. ? Move your shoulder blades down, and avoid letting your shoulders move up toward your ears. ? Keep your feet on the ground. As you get stronger, your feet should support less of your body weight as you lift yourself up. 4. Hold for __________ seconds. 5. Slowly lower yourself back into the chair. Repeat __________ times. Complete this exercise __________ times a day. Wall push-ups  1. Stand so you are facing a stable wall. Your feet should be about one arm-length away from the wall. 2. Lean forward and place your palms on the wall at shoulder height. 3. Keep your feet flat on the floor as you bend your elbows and lean forward toward the wall. 4. Hold for __________ seconds. 5. Straighten your elbows to push yourself back to the starting position. Repeat __________ times. Complete this exercise __________ times a day. This information is not intended to replace advice given to you by your health care provider. Make sure you discuss any questions you have with your health care provider. Document Revised: 11/26/2018 Document Reviewed: 09/03/2018 [employer] Patient Education  2020 [employer].

## 2019-08-29 NOTE — Progress Notes (Signed)
Patient: Vincent Clark, Male    DOB: [birth year], 44 y.o.   MRN: [id number] Visit Date: 08/29/2019  Today's Provider: Margaretann Loveless, PA-C   Chief Complaint  Patient presents with  . Annual Exam   Subjective:     Annual physical exam Vincent Clark is a 44 y.o. male who presents today for health maintenance and complete physical. He feels well. He reports exercising. He reports he is sleeping fairly well.  He c/o bowels changes. He reports that every time after eating 15-45 minutes he goes to the bathroom. -----------------------------------------------------------------  He wants to be referred to podiatry.  Chest pain/tightness has been gong on for a couple of weeks off an on specially at night. Reports that he feels sob and like he needs to take deep breath. Reports that the pain dull and mostly is on the left side and it last for a couple minutes. Patient with history of anxiety. Reports it may be secondary to cannabis use that he uses for his anxiety.  Review of Systems  Constitutional: Negative.   HENT: Negative.   Eyes: Negative.   Respiratory: Positive for chest tightness and shortness of breath. Negative for cough and wheezing.   Cardiovascular: Positive for chest pain. Negative for palpitations and leg swelling.  Gastrointestinal: Positive for abdominal pain (after eating) and diarrhea (sometimes after eating). Negative for anal bleeding, blood in stool, constipation, nausea, rectal pain and vomiting.  Endocrine: Negative.   Genitourinary: Negative.   Musculoskeletal: Positive for arthralgias, back pain and neck stiffness.  Skin: Positive for rash.  Allergic/Immunologic: Negative.   Neurological: Negative.   Hematological: Negative.   Psychiatric/Behavioral: Negative.     Social History      He  reports that he has quit smoking. His smoking use included e-cigarettes. He has never used smokeless tobacco. He reports current alcohol use of about 8.0 standard  drinks of alcohol per week. He reports that he does not use drugs.       Social History   Socioeconomic History  . Marital status: Married    Spouse name: Not on file  . Number of children: Not on file  . Years of education: Not on file  . Highest education level: Not on file  Occupational History  . Not on file  Tobacco Use  . Smoking status: Former Smoker    Types: E-cigarettes  . Smokeless tobacco: Never Used  . Tobacco comment: one month ago. 09/19/15  Substance and Sexual Activity  . Alcohol use: Yes    Alcohol/week: 8.0 standard drinks    Types: 8 Shots of liquor per week    Comment: Occasional  . Drug use: No  . Sexual activity: Yes  Other Topics Concern  . Not on file  Social History Narrative  . Not on file   Social Determinants of Health   Financial Resource Strain:   . Difficulty of Paying Living Expenses: Not on file  Food Insecurity:   . Worried About Programme researcher, broadcasting/film/video in the Last Year: Not on file  . Ran Out of Food in the Last Year: Not on file  Transportation Needs:   . Lack of Transportation (Medical): Not on file  . Lack of Transportation (Non-Medical): Not on file  Physical Activity:   . Days of Exercise per Week: Not on file  . Minutes of Exercise per Session: Not on file  Stress:   . Feeling of Stress : Not on file  Social Connections:   .  Frequency of Communication with Friends and Family: Not on file  . Frequency of Social Gatherings with Friends and Family: Not on file  . Attends Religious Services: Not on file  . Active Member of Clubs or Organizations: Not on file  . Attends Banker Meetings: Not on file  . Marital Status: Not on file    History reviewed. No pertinent past medical history.   Patient Active Problem List   Diagnosis Date Noted  . History of systemic reaction to bee sting 06/29/2015    Past Surgical History:  Procedure Laterality Date  . HERNIA REPAIR  2004    Family History        Family Status   Relation Name Status  . Mother  Alive  . Father  Alive  . Brother  Alive  . Mat Uncle 1 Alive  . [employer]  Alive  . MGM  Deceased  . Mat Uncle 2 Alive        His family history includes Asthma in his brother; Bipolar disorder in his father and maternal uncle; Diabetes in his maternal grandmother and mother; Heart disease in his father; Mental illness in his maternal uncle.      Allergies  Allergen Reactions  . Other     Other reaction(s): Other (See Comments) Bees/wasp/hornets- swelling/cant breathe     Current Outpatient Medications:  .  triamcinolone cream (KENALOG) 0.1 %, Apply 1 application topically 2 (two) times daily. (Patient not taking: Reported on 08/29/2019), Disp: 80 g, Rfl: 0   Patient Care Team: Reine Just as PCP - General (Family Medicine)    Objective:    Vitals: BP 116/77 (BP Location: Left Arm, Patient Position: Sitting, Cuff Size: Large)   Pulse (!) 49   Temp (!) 97.3 F (36.3 C) (Temporal)   Resp 16   Ht 5\' 9"  (1.753 m)   Wt 203 lb (92.1 kg)   BMI 29.98 kg/m    Vitals:   08/29/19 0716  BP: 116/77  Pulse: (!) 49  Resp: 16  Temp: (!) 97.3 F (36.3 C)  TempSrc: Temporal  Weight: 203 lb (92.1 kg)  Height: 5\' 9"  (1.753 m)     Physical Exam Vitals reviewed.  Constitutional:      General: He is not in acute distress.    Appearance: Normal appearance. He is well-developed and normal weight. He is not ill-appearing.  HENT:     Head: Normocephalic and atraumatic.     Right Ear: Tympanic membrane, ear canal and external ear normal.     Left Ear: Tympanic membrane, ear canal and external ear normal.  Eyes:     General: No scleral icterus.       Right eye: No discharge.        Left eye: No discharge.     Extraocular Movements: Extraocular movements intact.     Conjunctiva/sclera: Conjunctivae normal.     Pupils: Pupils are equal, round, and reactive to light.  Neck:     Thyroid: No thyromegaly.     Vascular: No carotid  bruit.     Trachea: No tracheal deviation.  Cardiovascular:     Rate and Rhythm: Normal rate and regular rhythm.     Pulses: Normal pulses.     Heart sounds: Normal heart sounds. No murmur.  Pulmonary:     Effort: Pulmonary effort is normal. No respiratory distress.     Breath sounds: Normal breath sounds. No wheezing or rales.  Chest:  Chest wall: No tenderness.  Abdominal:     General: Abdomen is flat. Bowel sounds are normal. There is no distension.     Palpations: Abdomen is soft. There is no mass.     Tenderness: There is no abdominal tenderness. There is no guarding or rebound.  Musculoskeletal:        General: No tenderness. Normal range of motion.     Cervical back: Normal range of motion and neck supple.     Right lower leg: No edema.     Left lower leg: No edema.  Lymphadenopathy:     Cervical: No cervical adenopathy.  Skin:    General: Skin is warm and dry.     Capillary Refill: Capillary refill takes less than 2 seconds.     Findings: No erythema or rash.  Neurological:     General: No focal deficit present.     Mental Status: He is alert and oriented to person, place, and time. Mental status is at baseline.     Cranial Nerves: No cranial nerve deficit.     Motor: No abnormal muscle tone.     Coordination: Coordination normal.     Deep Tendon Reflexes: Reflexes are normal and symmetric. Reflexes normal.  Psychiatric:        Mood and Affect: Mood normal.        Behavior: Behavior normal.        Thought Content: Thought content normal.        Judgment: Judgment normal.      Depression Screen PHQ 2/9 Scores 08/29/2019 05/13/2018 04/03/2017 10/14/2016  PHQ - 2 Score 0 0 1 0  PHQ- 9 Score - 4 10 10        Assessment & Plan:     Routine Health Maintenance and Physical Exam  Exercise Activities and Dietary recommendations Goals   None     Immunization History  Administered Date(s) Administered  . Influenza,inj,Quad PF,6+ Mos 06/07/2015, 05/13/2018  .  Tdap 07/01/2015    Health Maintenance  Topic Date Due  . HIV Screening  08/14/1993  . INFLUENZA VACCINE  03/19/2019  . TETANUS/TDAP  06/30/2025     Discussed health benefits of physical activity, and encouraged him to engage in regular exercise appropriate for his age and condition.    1. Encounter for annual physical exam Normal physical exam today. Will check labs as below and f/u pending lab results. If labs are stable and WNL he will not need to have these rechecked for one year at his next annual physical exam. He is to call the office in the meantime if he has any acute issue, questions or concerns. - CBC w/Diff - Comprehensive Metabolic Panel (CMET) - HgB A1c - TSH  2. Encounter for lipid screening for cardiovascular disease Will check labs as below and f/u pending results. - Lipid Profile  3. Diabetes mellitus screening Will check labs as below and f/u pending results. - HgB A1c  4. Prostate cancer screening Will check labs as below and f/u pending results. - PSA  5. Chest pain, unspecified type New onset. Has been occurring at night mostly. Active through day without symptoms. Noted Bradycardia that is new. Will refer to cardiology for further evaluation.  - CBC w/Diff - Comprehensive Metabolic Panel (CMET) - TSH - EKG 12-Lead - Ambulatory referral to Cardiology  6. Bradycardia See above medical treatment plan. - Ambulatory referral to Cardiology  7. BMI 29.0-29.9,adult Counseled patient on healthy lifestyle modifications including dieting and exercise.  -  Comprehensive Metabolic Panel (CMET) - HgB A1c - Lipid Profile  8. Plantar fasciitis, bilateral Noted bilaterally. Discussed conservative treatment with stretching, icing, and good arch support. Will refer to podiatry as below.  - Ambulatory referral to Podiatry  9. Need for influenza vaccination Flu vaccine given today without complication. Patient sat upright for 15 minutes to check for adverse  reaction before being released. - Flu Vaccine QUAD 36+ mos PF IM (Fluarix & Fluzone Quad PF)  --------------------------------------------------------------------    Margaretann Loveless, PA-C  [facility]

## 2019-08-30 ENCOUNTER — Telehealth: Payer: Self-pay

## 2019-08-30 LAB — COMPREHENSIVE METABOLIC PANEL
ALT: 13 IU/L (ref 0–44)
AST: 14 IU/L (ref 0–40)
Albumin/Globulin Ratio: 1.7 (ref 1.2–2.2)
Albumin: 4.1 g/dL (ref 4.0–5.0)
Alkaline Phosphatase: 55 IU/L (ref 39–117)
BUN/Creatinine Ratio: 10 (ref 9–20)
BUN: 12 mg/dL (ref 6–24)
Bilirubin Total: 0.6 mg/dL (ref 0.0–1.2)
CO2: 20 mmol/L (ref 20–29)
Calcium: 9.2 mg/dL (ref 8.7–10.2)
Chloride: 107 mmol/L — ABNORMAL HIGH (ref 96–106)
Creatinine, Ser: 1.17 mg/dL (ref 0.76–1.27)
GFR calc Af Amer: 89 mL/min/{1.73_m2} (ref >59)
GFR calc non Af Amer: 77 mL/min/{1.73_m2} (ref >59)
Globulin, Total: 2.4 g/dL (ref 1.5–4.5)
Glucose: 99 mg/dL (ref 65–99)
Potassium: 3.9 mmol/L (ref 3.5–5.2)
Sodium: 141 mmol/L (ref 134–144)
Total Protein: 6.5 g/dL (ref 6.0–8.5)

## 2019-08-30 LAB — CBC WITH DIFFERENTIAL/PLATELET
Basophils Absolute: 0 10*3/uL (ref 0.0–0.2)
Basos: 1 %
EOS (ABSOLUTE): 0.3 10*3/uL (ref 0.0–0.4)
Eos: 3 %
Hematocrit: 43.1 % (ref 37.5–51.0)
Hemoglobin: 14.4 g/dL (ref 13.0–17.7)
Immature Grans (Abs): 0 10*3/uL (ref 0.0–0.1)
Immature Granulocytes: 0 %
Lymphocytes Absolute: 3.2 10*3/uL — ABNORMAL HIGH (ref 0.7–3.1)
Lymphs: 40 %
MCH: 26.7 pg (ref 26.6–33.0)
MCHC: 33.4 g/dL (ref 31.5–35.7)
MCV: 80 fL (ref 79–97)
Monocytes Absolute: 0.8 10*3/uL (ref 0.1–0.9)
Monocytes: 10 %
Neutrophils Absolute: 3.7 10*3/uL (ref 1.4–7.0)
Neutrophils: 46 %
Platelets: 267 10*3/uL (ref 150–450)
RBC: 5.4 x10E6/uL (ref 4.14–5.80)
RDW: 13.4 % (ref 11.6–15.4)
WBC: 7.9 10*3/uL (ref 3.4–10.8)

## 2019-08-30 LAB — LIPID PANEL
Chol/HDL Ratio: 2.4 ratio (ref 0.0–5.0)
Cholesterol, Total: 131 mg/dL (ref 100–199)
HDL: 55 mg/dL (ref >39)
LDL Chol Calc (NIH): 63 mg/dL (ref 0–99)
Triglycerides: 63 mg/dL (ref 0–149)
VLDL Cholesterol Cal: 13 mg/dL (ref 5–40)

## 2019-08-30 LAB — HEMOGLOBIN A1C
Est. average glucose Bld gHb Est-mCnc: 120 mg/dL
Hgb A1c MFr Bld: 5.8 % — ABNORMAL HIGH (ref 4.8–5.6)

## 2019-08-30 LAB — PSA: Prostate Specific Ag, Serum: 1.1 ng/mL (ref 0.0–4.0)

## 2019-08-30 LAB — TSH: TSH: 0.837 u[IU]/mL (ref 0.450–4.500)

## 2019-08-30 NOTE — Telephone Encounter (Signed)
   Result Notes and Comments to Patient Comment seen by patient Mauri Brooklyn on 08/30/2019 10:45 AM EST

## 2019-08-30 NOTE — Telephone Encounter (Signed)
-----   Message from Margaretann Loveless, [place] sent at 08/30/2019 10:44 AM EST ----- Blood count is normal. Kidney and liver function are normal. Sodium, potassium and calcium are normal. A1c/Sugar is borderline high still but stable at 5.8. Thyroid is normal. PSA is normal. Cholesterol is normal.

## 2019-09-06 ENCOUNTER — Ambulatory Visit: Payer: Managed Care, Other (non HMO) | Admitting: Cardiovascular Disease

## 2019-09-22 ENCOUNTER — Other Ambulatory Visit: Payer: Self-pay

## 2019-09-22 ENCOUNTER — Ambulatory Visit: Payer: Managed Care, Other (non HMO) | Admitting: Cardiovascular Disease

## 2019-09-22 ENCOUNTER — Encounter: Payer: Self-pay | Admitting: Cardiovascular Disease

## 2019-09-22 VITALS — BP 130/90 | HR 53 | Ht 69.0 in | Wt 205.5 lb

## 2019-09-22 DIAGNOSIS — R001 Bradycardia, unspecified: Secondary | ICD-10-CM | POA: Diagnosis not present

## 2019-09-22 DIAGNOSIS — R072 Precordial pain: Secondary | ICD-10-CM | POA: Diagnosis not present

## 2019-09-22 NOTE — Progress Notes (Signed)
Cardiology Office Note   Date:  09/22/2019   ID:  Vincent Clark, DOB [birth year], MRN [id number]  PCP:  Mar Daring, PA-C  Cardiologist:   Kathlyn Sacramento, MD   Chief Complaint  Patient presents with  . New Patient (Initial Visit)    Chest pain, intermittent palpitations, occasional SOB, and bradycardia; Meds verbally reviewed with patient.      History of Present Illness: Vincent Clark is a 44 y.o. male who was referred by Fenton Malling for evaluation of chest pain.  He has no prior cardiac history and has been relatively healthy throughout his life.  He does use marijuana almost on a daily basis to relieve his stress.  He does not use tobacco products.  He drinks small amount of alcohol almost daily.  Over the last few months, he has experienced intermittent episodes of substernal chest pain described as sharp, tightness and dull achy feeling that lasts for few minutes.  This can happen with exertion or at rest.  Sometimes it happens when he is just lying down and not doing anything.  He feels that these symptoms are becoming more frequent than before.  He does complain with associated dyspnea and the need to take a deep breath.  He was noted to be bradycardic recently but he denies any dizziness, syncope or presyncope.  He started an exercise program about 3 weeks ago.  Today, he had some chest discomfort while exercising and thus he slowed down a little bit to improve the symptoms.  He reports family history of coronary artery disease affecting the males in his family but he does not know the details or the age of onset.  He works as a Biomedical scientist.      History reviewed. No pertinent past medical history.  Past Surgical History:  Procedure Laterality Date  . HERNIA REPAIR  2004     Current Outpatient Medications  Medication Sig Dispense Refill  . Multiple Vitamin (MULTIVITAMIN ADULT PO) Take 2 capsules by mouth daily.    . NON FORMULARY Take 3 capsules by mouth daily.  Nugenix    . triamcinolone cream (KENALOG) 0.1 % Apply 1 application topically 2 (two) times daily. (Patient not taking: Reported on 08/29/2019) 80 g 0   No current facility-administered medications for this visit.    Allergies:   Other    Social History:  The patient  reports that he has quit smoking. He has never used smokeless tobacco. He reports current alcohol use of about 8.0 standard drinks of alcohol per week. He reports current drug use. Drug: Marijuana.   Family History:  The patient's family history includes Asthma in his brother; Bipolar disorder in his father and maternal uncle; Diabetes in his maternal grandmother and mother; Heart disease in his father; Mental illness in his maternal uncle.    ROS:  Please see the history of present illness.   Otherwise, review of systems are positive for none.   All other systems are reviewed and negative.    PHYSICAL EXAM: VS:  BP 130/90 (BP Location: Right Arm, Patient Position: Sitting, Cuff Size: Normal)   Pulse (!) 53   Ht 5\' 9"  (1.753 m)   Wt 205 lb 8 oz (93.2 kg)   SpO2 98%   BMI 30.35 kg/m  , BMI Body mass index is 30.35 kg/m. GEN: Well nourished, well developed, in no acute distress  HEENT: normal  Neck: no JVD, carotid bruits, or masses Cardiac: RRR; no murmurs, rubs, or gallops,no edema  Respiratory:  clear to auscultation bilaterally, normal work of breathing GI: soft, nontender, nondistended, + BS MS: no deformity or atrophy  Skin: warm and dry, no rash Neuro:  Strength and sensation are intact Psych: euthymic mood, full affect   EKG:  EKG is ordered today. The ekg ordered today demonstrates sinus bradycardia with no significant ST or T wave changes.   Recent Labs: 08/29/2019: ALT 13; BUN 12; Creatinine, Ser 1.17; Hemoglobin 14.4; Platelets 267; Potassium 3.9; Sodium 141; TSH 0.837    Lipid Panel    Component Value Date/Time   CHOL 131 08/29/2019 0827   TRIG 63 08/29/2019 0827   HDL 55 08/29/2019 0827    CHOLHDL 2.4 08/29/2019 0827   LDLCALC 63 08/29/2019 0827      Wt Readings from Last 3 Encounters:  09/22/19 205 lb 8 oz (93.2 kg)  08/29/19 203 lb (92.1 kg)  06/02/18 204 lb 3.2 oz (92.6 kg)        PAD Screen 09/22/2019  Previous PAD dx? No  Previous surgical procedure? No  Pain with walking? Yes  Subsides with rest? Yes  Feet/toe relief with dangling? No  Painful, non-healing ulcers? No  Extremities discolored? No      ASSESSMENT AND PLAN:  1.  Chest pain with some atypical and some anginal features: The chest pain is mostly atypical but I am concerned about the chest pain happening while he exercises.  Fortunately, his cardiac exam is unremarkable and EKG does not show any ischemic changes.  Given his symptoms, I recommend evaluation with CTA of the coronary arteries with FFR if needed.  Given that he is chronically bradycardic, no need to treat with a beta-blocker before the test.  2.  Sinus bradycardia: He appears to be completely asymptomatic from this.  Recent TSH was normal.  No further work-up of this is needed given lack of symptoms.  3.  Marijuana use: I advised him to cut down.    Disposition:   FU with me as needed  Signed,  Lorine Bears, MD  09/22/2019 2:33 PM    [facility]

## 2019-09-22 NOTE — Patient Instructions (Addendum)
Medication Instructions:  Your physician recommends that you continue on your current medications as directed. Please refer to the Current Medication list given to you today.  *If you need a refill on your cardiac medications before your next appointment, please call your pharmacy*  Lab Work: You will need labwork (bmet) prior to your Cardiac CTA. Please have your lab drawn 2-3 days prior to your CT at the [facility] medical mall.  You do not need an appointment. Their hours are Mon-Fri 7am-6pm.  If you have labs (blood work) drawn today and your tests are completely normal, you will receive your results only by: Marland Kitchen MyChart Message (if you have MyChart) OR . A paper copy in the mail If you have any lab test that is abnormal or we need to change your treatment, we will call you to review the results.  Testing/Procedures: Your physician has requested that you have cardiac CT. Cardiac computed tomography (CT) is a painless test that uses an x-ray machine to take clear, detailed pictures of your heart. For further information please visit [facility]tn. Please follow instruction sheet as given.     Follow-Up: At [facility], you and your health needs are our priority.  As part of our continuing mission to provide you with exceptional heart care, we have created designated Provider Care Teams.  These Care Teams include your primary Cardiologist (physician) and Advanced Practice Providers (APPs -  Physician Assistants and Nurse Practitioners) who all work together to provide you with the care you need, when you need it.  Your next appointment:   As needed   The format for your next appointment:   In Person  Provider:    You may see Dr. Fletcher Anon or one of the following Advanced Practice Providers on your designated Care Team:    Murray Hodgkins, NP  Christell Faith, PA-C  Marrianne Mood, PA-C   Other Instructions Your cardiac CT will be scheduled at one of the below locations:    [facility] [address] [place], [place] [address] [phone]  [place] [address] [place],  [address] [phone]  If scheduled at [facility], please arrive at the [facility] main entrance of [facility] 30 minutes prior to test start time. Proceed to the [facility] Radiology Department (first floor) to check-in and test prep.  If scheduled at [facility], please arrive 15 mins early for check-in and test prep.  Please follow these instructions carefully (unless otherwise directed):  Hold all erectile dysfunction medications at least 3 days (72 hrs) prior to test.  On the Night Before the Test: . Be sure to Drink plenty of water. . Do not consume any caffeinated/decaffeinated beverages or chocolate 12 hours prior to your test. . Do not take any antihistamines 12 hours prior to your test.  On the Day of the Test: . Drink plenty of water. Do not drink any water within one hour of the test. . Do not eat any food 4 hours prior to the test. . You may take your regular medications prior to the test.       After the Test: . Drink plenty of water. . After receiving IV contrast, you may experience a mild flushed feeling. This is normal. . On occasion, you may experience a mild rash up to 24 hours after the test. This is not dangerous. If this occurs, you can take Benadryl 25 mg and increase  your fluid intake. . If you experience trouble breathing, this can be serious. If it is severe call 911 IMMEDIATELY. If it is mild, please call our office. . If you take any of these medications: Glipizide/Metformin, Avandament, Glucavance, please do not take 48 hours after completing test unless otherwise instructed.   Once we have confirmed authorization from your insurance company, we will call you to set up a date and time for your test.   For non-scheduling  related questions, please contact the cardiac imaging nurse navigator should you have any questions/concerns: Rockwell Alexandria, RN Navigator Cardiac Imaging [facility] [phone] mobile

## 2019-10-11 ENCOUNTER — Telehealth: Payer: Self-pay | Admitting: Cardiovascular Disease

## 2019-10-11 DIAGNOSIS — R072 Precordial pain: Secondary | ICD-10-CM

## 2019-10-11 NOTE — Telephone Encounter (Signed)
Patient wife calling to see if he can do pre ct lab tomorrow at a [facility] location.     Please call.  CT is 2/25

## 2019-10-11 NOTE — Telephone Encounter (Signed)
DPR on file. Returned the call to the patients wife. The patients Cardiac CTA is scheduled for 10/13/19. He is working today and not able to come to the medical mall for the needed Bmet. Patient is able to go to his local [facility] tomorrow morning for the lab. Bmet order for [facility] placed in Epic as stat to insure resulting in time for testing.

## 2019-10-12 ENCOUNTER — Other Ambulatory Visit: Payer: Self-pay

## 2019-10-12 ENCOUNTER — Encounter (HOSPITAL_COMMUNITY): Payer: Self-pay

## 2019-10-12 ENCOUNTER — Telehealth (HOSPITAL_COMMUNITY): Payer: Self-pay | Admitting: Emergency Medicine

## 2019-10-12 DIAGNOSIS — R072 Precordial pain: Secondary | ICD-10-CM

## 2019-10-12 NOTE — Telephone Encounter (Signed)
Spoke with the patient. Advised the patient that I have released the order for the lab (bmet) and it should be viewable by [facility]. He will head back there now. Advised the patient if there is an issue with them locating the order he should call back while still at [facility].  Patient verbalized understanding.

## 2019-10-12 NOTE — Telephone Encounter (Signed)
Left message on voicemail with name and callback number Selso Mannor RN Navigator Cardiac Imaging [facility] [phone] Office [phone] Cell  

## 2019-10-12 NOTE — Telephone Encounter (Signed)
Patient spouse states when he went to [facility], the orders were not there. Please call to discuss.

## 2019-10-13 ENCOUNTER — Other Ambulatory Visit: Payer: Self-pay

## 2019-10-13 ENCOUNTER — Ambulatory Visit
Admission: RE | Admit: 2019-10-13 | Discharge: 2019-10-13 | Disposition: A | Discharge status: Home / Self Care | Payer: Managed Care, Other (non HMO) | Source: Ambulatory Visit | Attending: Cardiovascular Disease | Admitting: Cardiovascular Disease

## 2019-10-13 DIAGNOSIS — R072 Precordial pain: Secondary | ICD-10-CM

## 2019-10-13 LAB — BASIC METABOLIC PANEL
BUN/Creatinine Ratio: 12 (ref 9–20)
BUN: 13 mg/dL (ref 6–24)
CO2: 19 mmol/L — ABNORMAL LOW (ref 20–29)
Calcium: 9.7 mg/dL (ref 8.7–10.2)
Chloride: 106 mmol/L (ref 96–106)
Creatinine, Ser: 1.05 mg/dL (ref 0.76–1.27)
GFR calc Af Amer: 101 mL/min/{1.73_m2} (ref >59)
GFR calc non Af Amer: 88 mL/min/{1.73_m2} (ref >59)
Glucose: 88 mg/dL (ref 65–99)
Potassium: 4.4 mmol/L (ref 3.5–5.2)
Sodium: 140 mmol/L (ref 134–144)

## 2019-10-13 MED ORDER — IOHEXOL 350 MG/ML SOLN
100.0000 mL | Freq: Once | INTRAVENOUS | Status: AC | PRN
  Administered 2019-10-13: 100 mL via INTRAVENOUS

## 2019-10-13 MED ORDER — NITROGLYCERIN 0.4 MG SL SUBL
0.8000 mg | SUBLINGUAL_TABLET | Freq: Once | SUBLINGUAL | Status: AC
  Administered 2019-10-13: 10:00:00 0.8 mg via SUBLINGUAL

## 2019-10-13 MED ORDER — ONDANSETRON HCL 4 MG/2ML IJ SOLN
4.0000 mg | Freq: Once | INTRAMUSCULAR | Status: AC
  Administered 2019-10-13: 11:00:00 4 mg via INTRAVENOUS

## 2019-10-13 NOTE — Progress Notes (Signed)
Patient experienced episode of N/V after CT scan completion. 4mg  of IV zofran given for N/V. Patient denies headache, chest pain, dizziness or shortness of breath. Vital signs stable. Patient feels ready to go home. I

## 2019-10-14 ENCOUNTER — Telehealth: Payer: Self-pay | Admitting: Cardiovascular Disease

## 2019-10-14 NOTE — Telephone Encounter (Signed)
    Please call to discuss CT results.   

## 2019-10-14 NOTE — Telephone Encounter (Signed)
Awaiting review of results by Dr Kirke Corin. Called patient and let him know we will make available on MyChart or call him once resulted.

## 2019-11-15 ENCOUNTER — Ambulatory Visit: Payer: Managed Care, Other (non HMO) | Admitting: Podiatry

## 2019-11-15 ENCOUNTER — Encounter: Payer: Self-pay | Admitting: *Deleted

## 2019-11-15 ENCOUNTER — Other Ambulatory Visit: Payer: Self-pay

## 2019-11-15 ENCOUNTER — Ambulatory Visit (INDEPENDENT_AMBULATORY_CARE_PROVIDER_SITE_OTHER): Payer: Managed Care, Other (non HMO)

## 2019-11-15 ENCOUNTER — Encounter: Payer: Self-pay | Admitting: Podiatry

## 2019-11-15 DIAGNOSIS — B351 Tinea unguium: Secondary | ICD-10-CM

## 2019-11-15 DIAGNOSIS — M722 Plantar fascial fibromatosis: Secondary | ICD-10-CM

## 2019-11-15 NOTE — Progress Notes (Signed)
Subjective:  Patient ID: Vincent Clark, male    DOB: [birth year],  MRN: [id number]  Chief Complaint  Patient presents with  . Foot Pain    pt is here for possible plantar fasciitis of both feet, pt states that it has been going on for 2-3 years, pain is gradual when walking on it. Pt states that pain is also elevated throughout the day.    44 y.o. male presents with the above complaint.  Patient presents with bilateral plantar fasciitis central band.  Patient states it hurts in both of the medial arches.  Patient states is been going for 3 to 4 years.  Pain is gradual her pain is elevated when applying pressure.  Pain scale is 9 out of 10.  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  He would like to know if there is any treatment medications for it.  Patient has tried over-the-counter topical application which has helped some but not much.  He denies any other acute complaints.   Review of Systems: Negative except as noted in the HPI. Denies N/V/F/Ch.  No past medical history on file.  Current Outpatient Medications:  Marland Kitchen  Multiple Vitamin (MULTIVITAMIN ADULT PO), Take 2 capsules by mouth daily., Disp: , Rfl:  .  NON FORMULARY, Take 3 capsules by mouth daily. Nugenix, Disp: , Rfl:  .  triamcinolone cream (KENALOG) 0.1 %, Apply 1 application topically 2 (two) times daily., Disp: 80 g, Rfl: 0  Social History   Tobacco Use  Smoking Status Former Smoker  Smokeless Tobacco Never Used  Tobacco Comment   one month ago. 09/19/15    Allergies  Allergen Reactions  . Other     Other reaction(s): Other (See Comments) Bees/wasp/hornets- swelling/cant breathe   Objective:  There were no vitals filed for this visit. There is no height or weight on file to calculate BMI. Constitutional Well developed. Well nourished.  Vascular Dorsalis pedis pulses palpable bilaterally. Posterior tibial pulses  palpable bilaterally. Capillary refill normal to all digits.  No cyanosis or clubbing noted. Pedal hair growth normal.  Neurologic Normal speech. Oriented to person, place, and time. Epicritic sensation to light touch grossly present bilaterally.  Dermatologic Nails well groomed and normal in appearance. No open wounds. No skin lesions.  Orthopedic: Normal joint ROM without pain or crepitus bilaterally. No visible deformities. Tender to palpation at the calcaneal tuber bilaterally. No pain with calcaneal squeeze bilaterally. Ankle ROM full range of motion bilaterally. Silfverskiold Test: negative bilaterally.   Radiographs: Taken and reviewed. No acute fractures or dislocations. No evidence of stress fracture.  Plantar heel spur absent. Posterior heel spur absent.  There is decreasing calcaneal inclination angle increase in talar declination angle mild break anterior in the cyma line.  No elevatus noted.  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  Assessment:   1. Plantar fasciitis, bilateral    Plan:  Patient was evaluated and treated and all questions answered.  Plantar Fasciitis, bilaterally central band - XR reviewed as above.  - Educated on icing and stretching. Instructions given.  - Injection delivered to the plantar fascia as below. - DME: Plantar Fascial Brace x2 - Pharmacologic management: None. Educated on risks/benefits and proper taking of medication.  Bilateral hallux onychomycosis -Educated the patient on the etiology of onychomycosis and various treatment options associated with improving the fungal load.  I explained to the patient that there is 3 treatment options available  to treat the onychomycosis including topical, p.o., laser treatment.  Patient elected to undergo p.o. options with Lamisil/terbinafine therapy.  In order for me to start the medication therapy, I explained to the patient the importance of evaluating the liver and  obtaining the liver function test.  Once the liver function test comes back normal I will start him on 72-month course of Lamisil therapy.  Patient understood all risk and would like to proceed with Lamisil therapy.  I have asked the patient to immediately stop the Lamisil therapy if she has any reactions to it and call the office or go to the emergency room right away.  Patient states understanding   Procedure: Injection Tendon/Ligament x2 Location: Bilateral plantar fascia at the glabrous junction; medial approach. Skin Prep: alcohol Injectate: 0.5 cc 0.5% marcaine plain, 0.5 cc of 1% Lidocaine, 0.5 cc kenalog 10. Disposition: Patient tolerated procedure well. Injection site dressed with a band-aid.  No follow-ups on file.

## 2019-12-13 ENCOUNTER — Ambulatory Visit: Payer: Managed Care, Other (non HMO) | Admitting: Podiatry

## 2020-01-05 ENCOUNTER — Ambulatory Visit (INDEPENDENT_AMBULATORY_CARE_PROVIDER_SITE_OTHER): Payer: Managed Care, Other (non HMO) | Admitting: Physician Assistant

## 2020-01-05 ENCOUNTER — Other Ambulatory Visit: Payer: Self-pay

## 2020-01-05 ENCOUNTER — Encounter: Payer: Self-pay | Admitting: Physician Assistant

## 2020-01-05 VITALS — BP 126/80 | HR 77 | Temp 96.6°F | Wt 199.0 lb

## 2020-01-05 DIAGNOSIS — R1031 Right lower quadrant pain: Secondary | ICD-10-CM | POA: Insufficient documentation

## 2020-01-05 MED ORDER — OMEPRAZOLE 20 MG PO CPDR: 20.0000 mg | DELAYED_RELEASE_CAPSULE | Freq: Two times a day (BID) | ORAL | 0 refills | Status: DC

## 2020-01-05 NOTE — Assessment & Plan Note (Signed)
Pt reports abdominal pain has been going on for at least a month, but has recently worsen.  Will check labs and H Pylori breath test. Trial of omeprazole 20mg  1 twice a day for 30 days.  Follow up with in one month or sooner if symptoms do not improve or worsen.

## 2020-01-05 NOTE — Progress Notes (Signed)
I,Laura E Walsh,acting as a Education administrator for [employer], PA-C.,have documented all relevant documentation on the behalf of Trinna Post, PA-C,as directed by  Trinna Post, PA-C while in the presence of Trinna Post, PA-C.   Established patient visit   Patient: Vincent Clark   DOB: 03/29/1976   44 y.o. Male  MRN: [id number] Visit Date: 01/05/2020  Today's healthcare provider: Trinna Post, PA-C   Chief Complaint  Patient presents with  . Abdominal Pain   Subjective    Abdominal Pain This is a chronic problem. Episode onset: Started over a month ago, but slowly worsening.  The problem has been gradually worsening. The pain is located in the generalized abdominal region. The quality of the pain is cramping. Associated symptoms include diarrhea and nausea (In the morning on occasionally ). Pertinent negatives include no anorexia, constipation, frequency or vomiting. The pain is aggravated by eating. Relieved by: Ginger ale. He has tried nothing for the symptoms. The treatment provided no relief.     Patient Active Problem List   Diagnosis Date Noted  . History of systemic reaction to bee sting 06/29/2015   No past medical history on file. Social History   Tobacco Use  . Smoking status: Former Research scientist (life sciences)  . Smokeless tobacco: Never Used  . Tobacco comment: one month ago. 09/19/15  Substance Use Topics  . Alcohol use: Yes    Alcohol/week: 8.0 standard drinks    Types: 8 Shots of liquor per week    Comment: daily  . Drug use: Yes    Types: Marijuana   Allergies  Allergen Reactions  . Other     Other reaction(s): Other (See Comments) Bees/wasp/hornets- swelling/cant breathe     Medications: Outpatient Medications Prior to Visit  Medication Sig  . Multiple Vitamin (MULTIVITAMIN ADULT PO) Take 2 capsules by mouth daily.  . NON FORMULARY Take 3 capsules by mouth daily. Nugenix  . triamcinolone cream (KENALOG) 0.1 % Apply 1 application topically 2 (two) times  daily. (Patient not taking: Reported on 01/05/2020)   No facility-administered medications prior to visit.    Review of Systems  Gastrointestinal: Positive for abdominal distention, abdominal pain, diarrhea and nausea (In the morning on occasionally ). Negative for anal bleeding, anorexia, blood in stool, constipation, rectal pain and vomiting.  Genitourinary: Negative for frequency.    Last CBC Lab Results  Component Value Date   WBC 7.9 08/29/2019   HGB 14.4 08/29/2019   HCT 43.1 08/29/2019   MCV 80 08/29/2019   MCH 26.7 08/29/2019   RDW 13.4 08/29/2019   PLT 267 71/01/2693   Last metabolic panel Lab Results  Component Value Date   GLUCOSE 88 10/12/2019   NA 140 10/12/2019   K 4.4 10/12/2019   CL 106 10/12/2019   CO2 19 (L) 10/12/2019   BUN 13 10/12/2019   CREATININE 1.05 10/12/2019   GFRNONAA 88 10/12/2019   GFRAA 101 10/12/2019   CALCIUM 9.7 10/12/2019   PROT 6.5 08/29/2019   ALBUMIN 4.1 08/29/2019   LABGLOB 2.4 08/29/2019   AGRATIO 1.7 08/29/2019   BILITOT 0.6 08/29/2019   ALKPHOS 55 08/29/2019   AST 14 08/29/2019   ALT 13 08/29/2019   Last lipids Lab Results  Component Value Date   CHOL 131 08/29/2019   HDL 55 08/29/2019   LDLCALC 63 08/29/2019   TRIG 63 08/29/2019   CHOLHDL 2.4 08/29/2019   Last hemoglobin A1c Lab Results  Component Value Date   HGBA1C 5.8 (  H) 08/29/2019   Last thyroid functions Lab Results  Component Value Date   TSH 0.837 08/29/2019   Last vitamin D No results found for: 25OHVITD2, 25OHVITD3, VD25OH Last vitamin B12 and Folate No results found for: VITAMINB12, FOLATE  Objective    BP 126/80 (BP Location: Left Arm, Patient Position: Sitting, Cuff Size: Large)   Pulse 77   Temp (!) 96.6 F (35.9 C) (Temporal)   Wt 199 lb (90.3 kg)   SpO2 98%   BMI 29.39 kg/m    Physical Exam Constitutional:      Appearance: Normal appearance. He is well-developed.  Cardiovascular:     Rate and Rhythm: Normal rate and regular  rhythm.     Pulses: Normal pulses.     Heart sounds: Normal heart sounds.  Pulmonary:     Effort: Pulmonary effort is normal.     Breath sounds: Normal breath sounds.  Abdominal:     Tenderness: There is abdominal tenderness in the right lower quadrant.  Skin:    General: Skin is warm and dry.  Neurological:     Mental Status: He is alert and oriented to person, place, and time. Mental status is at baseline.  Psychiatric:        Mood and Affect: Mood normal.        Behavior: Behavior normal.        Thought Content: Thought content normal.        Judgment: Judgment normal.       No results found for any visits on 01/05/20.  Assessment & Plan     Problem List Items Addressed This Visit      Other   Right lower quadrant abdominal pain - Primary    Pt reports abdominal pain has been going on for at least a month, but has recently worsen.  Will check labs and H Pylori breath test. Trial of omeprazole 20mg  1 twice a day for 30 days.  Follow up with in one month or sooner if symptoms do not improve or worsen.          Relevant Medications   omeprazole (PRILOSEC) 20 MG capsule   Other Relevant Orders   Comprehensive metabolic panel   CBC with Differential/Platelet   Lipase   Amylase   H. pylori breath test        No follow-ups on file.      IAntony Contras, PA-C, have reviewed all documentation for this visit. The documentation on 01/05/20 for the exam, diagnosis, procedures, and orders are all accurate and complete.    01/07/20  [facility] [phone] (phone) [phone] (fax)  [facility]

## 2020-01-06 LAB — COMPREHENSIVE METABOLIC PANEL
ALT: 23 IU/L (ref 0–44)
AST: 17 IU/L (ref 0–40)
Albumin/Globulin Ratio: 1.8 (ref 1.2–2.2)
Albumin: 4.6 g/dL (ref 4.0–5.0)
Alkaline Phosphatase: 68 IU/L (ref 48–121)
BUN/Creatinine Ratio: 10 (ref 9–20)
BUN: 9 mg/dL (ref 6–24)
Bilirubin Total: 1.2 mg/dL (ref 0.0–1.2)
CO2: 19 mmol/L — ABNORMAL LOW (ref 20–29)
Calcium: 10 mg/dL (ref 8.7–10.2)
Chloride: 103 mmol/L (ref 96–106)
Creatinine, Ser: 0.9 mg/dL (ref 0.76–1.27)
GFR calc Af Amer: 122 mL/min/{1.73_m2} (ref >59)
GFR calc non Af Amer: 106 mL/min/{1.73_m2} (ref >59)
Globulin, Total: 2.6 g/dL (ref 1.5–4.5)
Glucose: 73 mg/dL (ref 65–99)
Potassium: 4 mmol/L (ref 3.5–5.2)
Sodium: 142 mmol/L (ref 134–144)
Total Protein: 7.2 g/dL (ref 6.0–8.5)

## 2020-01-06 LAB — H. PYLORI BREATH TEST: H pylori Breath Test: NEGATIVE

## 2020-01-06 LAB — CBC WITH DIFFERENTIAL/PLATELET
Basophils Absolute: 0 10*3/uL (ref 0.0–0.2)
Basos: 0 %
EOS (ABSOLUTE): 0.1 10*3/uL (ref 0.0–0.4)
Eos: 1 %
Hematocrit: 43.4 % (ref 37.5–51.0)
Hemoglobin: 14.9 g/dL (ref 13.0–17.7)
Immature Grans (Abs): 0 10*3/uL (ref 0.0–0.1)
Immature Granulocytes: 0 %
Lymphocytes Absolute: 2.5 10*3/uL (ref 0.7–3.1)
Lymphs: 27 %
MCH: 26.4 pg — ABNORMAL LOW (ref 26.6–33.0)
MCHC: 34.3 g/dL (ref 31.5–35.7)
MCV: 77 fL — ABNORMAL LOW (ref 79–97)
Monocytes Absolute: 0.8 10*3/uL (ref 0.1–0.9)
Monocytes: 8 %
Neutrophils Absolute: 6 10*3/uL (ref 1.4–7.0)
Neutrophils: 64 %
Platelets: 279 10*3/uL (ref 150–450)
RBC: 5.65 x10E6/uL (ref 4.14–5.80)
RDW: 13.1 % (ref 11.6–15.4)
WBC: 9.5 10*3/uL (ref 3.4–10.8)

## 2020-01-06 LAB — AMYLASE: Amylase: 41 U/L (ref 31–110)

## 2020-01-06 LAB — LIPASE: Lipase: 18 U/L (ref 13–78)

## 2020-01-23 ENCOUNTER — Other Ambulatory Visit: Payer: Self-pay

## 2020-01-23 ENCOUNTER — Ambulatory Visit: Payer: Managed Care, Other (non HMO) | Admitting: Physician Assistant

## 2020-01-23 ENCOUNTER — Encounter: Payer: Self-pay | Admitting: Physician Assistant

## 2020-01-23 VITALS — BP 123/79 | HR 55 | Temp 96.9°F | Resp 16 | Wt 200.4 lb

## 2020-01-23 DIAGNOSIS — I83812 Varicose veins of left lower extremities with pain: Secondary | ICD-10-CM | POA: Diagnosis not present

## 2020-01-23 DIAGNOSIS — K580 Irritable bowel syndrome with diarrhea: Secondary | ICD-10-CM

## 2020-01-23 DIAGNOSIS — R109 Unspecified abdominal pain: Secondary | ICD-10-CM | POA: Diagnosis not present

## 2020-01-23 MED ORDER — DICYCLOMINE HCL 10 MG PO CAPS: 10.0000 mg | ORAL_CAPSULE | Freq: Three times a day (TID) | ORAL | 0 refills | Status: AC

## 2020-01-23 NOTE — Progress Notes (Signed)
Established patient visit   Patient: Vincent Clark   DOB: August 06, 1976   44 y.o. Male  MRN: [id number] Visit Date: 01/23/2020  Today's healthcare provider: Margaretann Loveless, PA-C   Chief Complaint  Patient presents with  . Follow-up    abdominal pain  . Edema   Subjective    HPI Follow up for abdominal pain  The patient was last seen for this 2 weeks ago. Changes made at last visit include trial of Omeprazole 20mg  twice a day for 30 days.  He reports poor compliance with treatment. He feels that condition is Unchanged. He is not having side effects. Reports that he took it for a couple of days and stopped because it was not helping. Reports that his stomach pain is after he eats.  ----------------------------------------------------------------------------------------- He is also c/o left leg swelling. This has been present for a while. Reports that he has talked about this with provider (about his veins). The edema is present at night after a long day. Reports he has some tingling with the swelling. Treatments tried: Nothing.   Patient Active Problem List   Diagnosis Date Noted  . Right lower quadrant abdominal pain 01/05/2020  . History of systemic reaction to bee sting 06/29/2015   History reviewed. No pertinent past medical history.     Medications: Outpatient Medications Prior to Visit  Medication Sig  . Multiple Vitamin (MULTIVITAMIN ADULT PO) Take 2 capsules by mouth daily.  . NON FORMULARY Take 3 capsules by mouth daily. Nugenix  . omeprazole (PRILOSEC) 20 MG capsule Take 1 capsule (20 mg total) by mouth 2 (two) times daily before a meal.  . triamcinolone cream (KENALOG) 0.1 % Apply 1 application topically 2 (two) times daily.   No facility-administered medications prior to visit.    Review of Systems  Constitutional: Negative for fatigue and fever.  Eyes: Negative for visual disturbance.  Respiratory: Positive for shortness of breath ("sometimes").  Negative for chest tightness and wheezing.   Cardiovascular: Positive for leg swelling. Negative for chest pain and palpitations.  Gastrointestinal: Positive for abdominal distention, abdominal pain and nausea.  Neurological: Positive for dizziness ("sometimes at work"). Negative for light-headedness and headaches.    Last CBC Lab Results  Component Value Date   WBC 9.5 01/05/2020   HGB 14.9 01/05/2020   HCT 43.4 01/05/2020   MCV 77 (L) 01/05/2020   MCH 26.4 (L) 01/05/2020   RDW 13.1 01/05/2020   PLT 279 01/05/2020   Last metabolic panel Lab Results  Component Value Date   GLUCOSE 73 01/05/2020   NA 142 01/05/2020   K 4.0 01/05/2020   CL 103 01/05/2020   CO2 19 (L) 01/05/2020   BUN 9 01/05/2020   CREATININE 0.90 01/05/2020   GFRNONAA 106 01/05/2020   GFRAA 122 01/05/2020   CALCIUM 10.0 01/05/2020   PROT 7.2 01/05/2020   ALBUMIN 4.6 01/05/2020   LABGLOB 2.6 01/05/2020   AGRATIO 1.8 01/05/2020   BILITOT 1.2 01/05/2020   ALKPHOS 68 01/05/2020   AST 17 01/05/2020   ALT 23 01/05/2020      Objective    BP 123/79 (BP Location: Left Arm, Patient Position: Sitting, Cuff Size: Large)   Pulse (!) 55   Temp (!) 96.9 F (36.1 C) (Temporal)   Resp 16   Wt 200 lb 6.4 oz (90.9 kg)   BMI 29.59 kg/m  BP Readings from Last 3 Encounters:  01/23/20 123/79  01/05/20 126/80  10/13/19 117/77   Wt Readings from  Last 3 Encounters:  01/23/20 200 lb 6.4 oz (90.9 kg)  01/05/20 199 lb (90.3 kg)  09/22/19 205 lb 8 oz (93.2 kg)      Physical Exam Vitals reviewed.  Constitutional:      General: He is not in acute distress.    Appearance: Normal appearance. He is well-developed, well-groomed and overweight. He is not ill-appearing or diaphoretic.  HENT:     Head: Normocephalic and atraumatic.  Eyes:     General: No scleral icterus.    Extraocular Movements: Extraocular movements intact.     Conjunctiva/sclera: Conjunctivae normal.  Cardiovascular:     Rate and Rhythm: Normal  rate and regular rhythm.     Pulses: Normal pulses.     Heart sounds: Normal heart sounds. No murmur. No friction rub. No gallop.   Pulmonary:     Effort: Pulmonary effort is normal. No respiratory distress.     Breath sounds: Normal breath sounds. No wheezing or rales.  Abdominal:     General: Abdomen is flat. Bowel sounds are normal. There is no distension.     Palpations: Abdomen is soft. There is no mass.     Tenderness: There is abdominal tenderness in the right upper quadrant and epigastric area. There is no guarding or rebound.  Musculoskeletal:     Cervical back: Normal range of motion and neck supple.  Skin:    General: Skin is warm and dry.  Neurological:     Mental Status: He is alert and oriented to person, place, and time.  Psychiatric:        Mood and Affect: Mood normal.        Behavior: Behavior normal. Behavior is cooperative.        Thought Content: Thought content normal.        Judgment: Judgment normal.      No results found for any visits on 01/23/20.  Assessment & Plan     1. Right sided abdominal pain Will get [place] as below to r/o gallbladder as source since pain is most associated with meals. I will f/u pending results.  - US Abdomen Limited RUQ; Future  2. Irritable bowel syndrome with diarrhea Possibly component of IBS. Will try Bentyl as below. Call if not improving.  - dicyclomine (BENTYL) 10 MG capsule; Take 1 capsule (10 mg total) by mouth 4 (four) times daily -  before meals and at bedtime.  Dispense: 120 capsule; Refill: 0  3. Varicose veins of left lower extremity with pain Patient does have visible varicosities with swelling and aching after standing on them all day. Discussed compression stockings, elevating legs when at rest. May use tylenol or ibuprofen prn for pain. Discussed referral to vein clinic, patient declines at this time. Can call if he changes mind on referral.    No follow-ups on file.      Reynolds Bowl, PA-C, have  reviewed all documentation for this visit. The documentation on 01/25/20 for the exam, diagnosis, procedures, and orders are all accurate and complete.   Rubye Beach  [facility] [phone] (phone) [phone] (fax)  [place]

## 2020-01-23 NOTE — Patient Instructions (Addendum)
18-20 mmHg 12-15 mmHg [place] (can buy on [employer])  L calf 16 in at widest, 15 in below knee and 10 in at ankle R calf 15.5 in at widest, 14.5 in below knee and 9.75 in at ankle   Chronic Venous Insufficiency Chronic venous insufficiency is a condition where the leg veins cannot effectively pump blood from the legs to the heart. This happens when the vein walls are either stretched, weakened, or damaged, or when the valves inside the vein are damaged. With the right treatment, you should be able to continue with an active life. This condition is also called venous stasis. What are the causes? Common causes of this condition include:  High blood pressure inside the veins (venous hypertension).  Sitting or standing too long, causing increased blood pressure in the leg veins.  A blood clot that blocks blood flow in a vein (deep vein thrombosis, DVT).  Inflammation of a vein (phlebitis) that causes a blood clot to form.  Tumors in the pelvis that cause blood to back up. What increases the risk? The following factors may make you more likely to develop this condition:  Having a family history of this condition.  Obesity.  Pregnancy.  Living without enough regular physical activity or exercise (sedentary lifestyle).  Smoking.  Having a job that requires long periods of standing or sitting in one place.  Being a certain age. Women in their 6s and 79s and men in their 46s are more likely to develop this condition. What are the signs or symptoms? Symptoms of this condition include:  Veins that are enlarged, bulging, or twisted (varicose veins).  Skin breakdown or ulcers.  Reddened skin or dark discoloration of skin on the leg between the knee and ankle.  Brown, smooth, tight, and painful skin just above the ankle, usually on the inside of the leg (lipodermatosclerosis).  Swelling of the legs. How is this diagnosed? This condition may be diagnosed based on:  Your medical  history.  A physical exam.  Tests, such as: ? A procedure that creates an image of a blood vessel and nearby organs and provides information about blood flow through the blood vessel (duplex ultrasound). ? A procedure that tests blood flow (plethysmography). ? A procedure that looks at the veins using X-ray and dye (venogram). How is this treated? The goals of treatment are to help you return to an active life and to minimize pain or disability. Treatment depends on the severity of your condition, and it may include:  Wearing compression stockings. These can help relieve symptoms and help prevent your condition from getting worse. However, they do not cure the condition.  Sclerotherapy. This procedure involves an injection of a solution that shrinks damaged veins.  Surgery. This may involve: ? Removing a diseased vein (vein stripping). ? Cutting off blood flow through the vein (laser ablation surgery). ? Repairing or reconstructing a valve within the affected vein. Follow these instructions at home:      Wear compression stockings as told by your health care provider. These stockings help to prevent blood clots and reduce swelling in your legs.  Take over-the-counter and prescription medicines only as told by your health care provider.  Stay active by exercising, walking, or doing different activities. Ask your health care provider what activities are safe for you and how much exercise you need.  Drink enough fluid to keep your urine pale yellow.  Do not use any products that contain nicotine or tobacco, such as cigarettes, e-cigarettes,  and chewing tobacco. If you need help quitting, ask your health care provider.  Keep all follow-up visits as told by your health care provider. This is important. Contact a health care provider if you:  Have redness, swelling, or more pain in the affected area.  See a red streak or line that goes up or down from the affected area.  Have skin  breakdown or skin loss in the affected area, even if the breakdown is small.  Get an injury in the affected area. Get help right away if:  You get an injury and an open wound in the affected area.  You have: ? Severe pain that does not get better with medicine. ? Sudden numbness or weakness in the foot or ankle below the affected area. ? Trouble moving your foot or ankle. ? A fever. ? Worse or persistent symptoms. ? Chest pain. ? Shortness of breath. Summary  Chronic venous insufficiency is a condition where the leg veins cannot effectively pump blood from the legs to the heart.  Chronic venous insufficiency occurs when the vein walls become stretched, weakened, or damaged, or when valves within the vein are damaged.  Treatment depends on how severe your condition is. It often involves wearing compression stockings and may involve having a procedure.  Make sure you stay active by exercising, walking, or doing different activities. Ask your health care provider what activities are safe for you and how much exercise you need. This information is not intended to replace advice given to you by your health care provider. Make sure you discuss any questions you have with your health care provider. Document Revised: 04/27/2018 Document Reviewed: 04/27/2018 Elsevier Patient Education  2020 [employer].   Diet for Irritable Bowel Syndrome When you have irritable bowel syndrome (IBS), it is very important to eat the foods and follow the eating habits that are best for your condition. IBS may cause various symptoms such as pain in the abdomen, constipation, or diarrhea. Choosing the right foods can help to ease the discomfort from these symptoms. Work with your health care provider and diet and nutrition specialist (dietitian) to find the eating plan that will help to control your symptoms. What are tips for following this plan?      Keep a food diary. This will help you identify foods  that cause symptoms. Write down: ? What you eat and when you eat it. ? What symptoms you have. ? When symptoms occur in relation to your meals, such as "pain in abdomen 2 hours after dinner."  Eat your meals slowly and in a relaxed setting.  Aim to eat 5-6 small meals per day. Do not skip meals.  Drink enough fluid to keep your urine pale yellow.  Ask your health care provider if you should take an over-the-counter probiotic to help restore healthy bacteria in your gut (digestive tract). ? Probiotics are foods that contain good bacteria and yeasts.  Your dietitian may have specific dietary recommendations for you based on your symptoms. He or she may recommend that you: ? Avoid foods that cause symptoms. Talk with your dietitian about other ways to get the same nutrients that are in those problem foods. ? Avoid foods with gluten. Gluten is a protein that is found in rye, wheat, and barley. ? Eat more foods that contain soluble fiber. Examples of foods with high soluble fiber include oats, seeds, and certain fruits and vegetables. Take a fiber supplement if directed by your dietitian. ? Reduce or avoid  certain foods called FODMAPs. These are foods that contain carbohydrates that are hard to digest. Ask your doctor which foods contain these carbohydrates. What foods are not recommended? The following are some foods and drinks that may make your symptoms worse:  Fatty foods, such as french fries.  Foods that contain gluten, such as pasta and cereal.  Dairy products, such as milk, cheese, and ice cream.  Chocolate.  Alcohol.  Products with caffeine, such as coffee.  Carbonated drinks, such as soda.  Foods that are high in FODMAPs. These include certain fruits and vegetables.  Products with sweeteners such as honey, high fructose corn syrup, sorbitol, and mannitol. The items listed above may not be a complete list of foods and beverages you should avoid. Contact a dietitian for more  information. What foods are good sources of fiber? Your health care provider or dietitian may recommend that you eat more foods that contain fiber. Fiber can help to reduce constipation and other IBS symptoms. Add foods with fiber to your diet a little at a time so your body can get used to them. Too much fiber at one time might cause gas and swelling of your abdomen. The following are some foods that are good sources of fiber:  Berries, such as raspberries, strawberries, and blueberries.  Tomatoes.  Carrots.  Brown rice.  Oats.  Seeds, such as chia and pumpkin seeds. The items listed above may not be a complete list of recommended sources of fiber. Contact your dietitian for more options. Where to find more information  International Foundation for Functional Gastrointestinal Disorders: www.iffgd.[employer] Diabetes and Digestive and Kidney Diseases: [employer] Summary  When you have irritable bowel syndrome (IBS), it is very important to eat the foods and follow the eating habits that are best for your condition.  IBS may cause various symptoms such as pain in the abdomen, constipation, or diarrhea.  Choosing the right foods can help to ease the discomfort that comes from symptoms.  Keep a food diary. This will help you identify foods that cause symptoms.  Your health care provider or diet and nutrition specialist (dietitian) may recommend that you eat more foods that contain fiber. This information is not intended to replace advice given to you by your health care provider. Make sure you discuss any questions you have with your health care provider. Document Revised: 11/24/2018 Document Reviewed: 04/07/2017 Elsevier Patient Education  [place].  Irritable Bowel Syndrome, Adult  Irritable bowel syndrome (IBS) is a group of symptoms that affects the organs responsible for digestion (gastrointestinal or GI tract). IBS is not one specific  disease. To regulate how the GI tract works, the body sends signals back and forth between the intestines and the brain. If you have IBS, there may be a problem with these signals. As a result, the GI tract does not function normally. The intestines may become more sensitive and overreact to certain things. This may be especially true when you eat certain foods or when you are under stress. There are four types of IBS. These may be determined based on the consistency of your stool (feces): IBS with diarrhea. IBS with constipation. Mixed IBS. Unsubtyped IBS. It is important to know which type of IBS you have. Certain treatments are more likely to be helpful for certain types of IBS. What are the causes? The exact cause of IBS is not known. What increases the risk? You may have a higher risk for IBS if you: Are  male. Are younger than 8740. Have a family history of IBS. Have a mental health condition, such as depression, anxiety, or post-traumatic stress disorder. Have had a bacterial infection of your GI tract. What are the signs or symptoms? Symptoms of IBS vary from person to person. The main symptom is abdominal pain or discomfort. Other symptoms usually include one or more of the following: Diarrhea, constipation, or both. Abdominal swelling or bloating. Feeling full after eating a small or regular-sized meal. Frequent gas. Mucus in the stool. A feeling of having more stool left after a bowel movement. Symptoms tend to come and go. They may be triggered by stress, mental health conditions, or certain foods. How is this diagnosed? This condition may be diagnosed based on a physical exam, your medical history, and your symptoms. You may have tests, such as: Blood tests. Stool test. X-rays. CT scan. Colonoscopy. This is a procedure in which your GI tract is viewed with a long, thin, flexible tube. How is this treated? There is no cure for IBS, but treatment can help relieve symptoms.  Treatment depends on the type of IBS you have, and may include: Changes to your diet, such as: Avoiding foods that cause symptoms. Drinking more water. Following a low-FODMAP (fermentable oligosaccharides, disaccharides, monosaccharides, and polyols) diet for up to 6 weeks, or as told by your health care provider. FODMAPs are sugars that are hard for some people to digest. Eating more fiber. Eating medium-sized meals at the same times every day. Medicines. These may include: Fiber supplements, if you have constipation. Medicine to control diarrhea (antidiarrheal medicines). Medicine to help control muscle tightening (spasms) in your GI tract (antispasmodic medicines). Medicines to help with mental health conditions, such as antidepressants or tranquilizers. Talk therapy or counseling. Working with a diet and nutrition specialist (dietitian) to help create a food plan that is right for you. Managing your stress. Follow these instructions at home: Eating and drinking Eat a healthy diet. Eat medium-sized meals at about the same time every day. Do not eat large meals. Gradually eat more fiber-rich foods. These include whole grains, fruits, and vegetables. This may be especially helpful if you have IBS with constipation. Eat a diet low in FODMAPs. Drink enough fluid to keep your urine pale yellow. Keep a journal of foods that seem to trigger symptoms. Avoid foods and drinks that: Contain added sugar. Make your symptoms worse. Dairy products, caffeinated drinks, and carbonated drinks can make symptoms worse for some people. General instructions Take over-the-counter and prescription medicines and supplements only as told by your health care provider. Get enough exercise. Do at least 150 minutes of moderate-intensity exercise each week. Manage your stress. Getting enough sleep and exercise can help you manage stress. Keep all follow-up visits as told by your health care provider and therapist.  This is important. Alcohol Use Do not drink alcohol if: Your health care provider tells you not to drink. You are pregnant, may be pregnant, or are planning to become pregnant. If you drink alcohol, limit how much you have: 0-1 drink a day for women. 0-2 drinks a day for men. Be aware of how much alcohol is in your drink. In the [place] one drink equals one typical bottle of beer (12 oz), one-half glass of wine (5 oz), or one shot of hard liquor (1 oz). Contact a health care provider if you have: Constant pain. Weight loss. Difficulty or pain when swallowing. Diarrhea that gets worse. Get help right away if you have: Severe  abdominal pain. Fever. Diarrhea with symptoms of dehydration, such as dizziness or dry mouth. Bright red blood in your stool. Stool that is black and tarry. Abdominal swelling. Vomiting that does not stop. Blood in your vomit. Summary Irritable bowel syndrome (IBS) is not one specific disease. It is a group of symptoms that affects digestion. Your intestines may become more sensitive and overreact to certain things. This may be especially true when you eat certain foods or when you are under stress. There is no cure for IBS, but treatment can help relieve symptoms. This information is not intended to replace advice given to you by your health care provider. Make sure you discuss any questions you have with your health care provider. Document Revised: 07/28/2017 Document Reviewed: 07/28/2017 Elsevier Patient Education  2020 [employer].

## 2020-01-30 ENCOUNTER — Telehealth: Payer: Self-pay | Admitting: Physician Assistant

## 2020-01-30 ENCOUNTER — Ambulatory Visit: Payer: Managed Care, Other (non HMO) | Admitting: Physician Assistant

## 2020-01-30 NOTE — Telephone Encounter (Signed)
Copied from CRM [id number]. Topic: Quick Communication - See Telephone Encounter >> Jan 30, 2020 10:12 AM Aretta Nip wrote: CRM for notification. See Telephone encounter for: 01/30/20.dicyclomine (BENTYL) 10 MG capsule Medication Date: 01/23/2020 Department: [facility] Ordering/Authorizing: Margaretann Loveless, PA-C  Pt was prescribed this after visit 6/7. Transmittal show to pharmacy FAILED. Pt needs this to be sent in asap (resent)

## 2020-01-30 NOTE — Telephone Encounter (Signed)
Phoned in this order for Dicyclomine 10 mg. Recent submission on 01/23/20 failed to transmit to pharmacy. Called patient to inform unable to leave message.

## 2020-02-02 ENCOUNTER — Ambulatory Visit: Payer: Managed Care, Other (non HMO)

## 2020-02-13 ENCOUNTER — Other Ambulatory Visit: Payer: Self-pay

## 2020-02-13 ENCOUNTER — Ambulatory Visit
Admission: RE | Admit: 2020-02-13 | Discharge: 2020-02-13 | Disposition: A | Discharge status: Home / Self Care | Payer: Managed Care, Other (non HMO) | Source: Ambulatory Visit | Attending: Physician Assistant | Admitting: Physician Assistant

## 2020-02-13 DIAGNOSIS — R109 Unspecified abdominal pain: Secondary | ICD-10-CM | POA: Diagnosis not present

## 2020-02-14 ENCOUNTER — Telehealth: Payer: Self-pay

## 2020-02-14 NOTE — Telephone Encounter (Signed)
-----   Message from Margaretann Loveless, [place] sent at 02/13/2020  5:54 PM EDT ----- US of the RUQ is completely normal. No gallstones. Liver is normal. Suspect stomach issues more related to gastritis. Continue Omeprazole but increase to 20mg  twice daily.

## 2020-02-14 NOTE — Telephone Encounter (Signed)
Written by Margaretann Loveless, PA-C on 02/13/2020 5:54 PM EDT Seen by patient Vincent Clark on 02/13/2020 11:48 PM

## 2020-04-19 IMAGING — CT CT HEART MORP W/ CTA COR W/ SCORE W/ CA W/CM &/OR W/O CM
1 of 15 series · 2 of 20 positions shown, 3 images · non-contrast
Comparison: Chest radiograph report 03/24/2017.

Addendum:
CLINICAL DATA: Hx of chestpain

EXAM:
Cardiac/Coronary  CTA
TECHNIQUE: The patient was scanned on a Siemens Somatoform go.Top scanner.

[Series 22: multiphase % cta coronary 0.60 · axial · 0.42mm/px · z∈[-1502,-1458]mm · 2 of 3330 slices shown, 3 images]
[im 1110/3330  vessel]
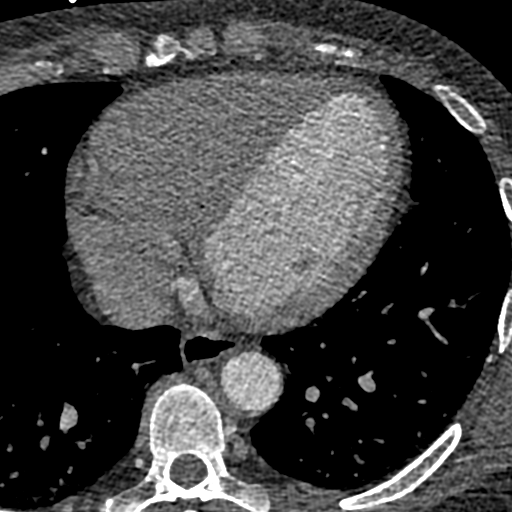
[im 1110/3330  lung]
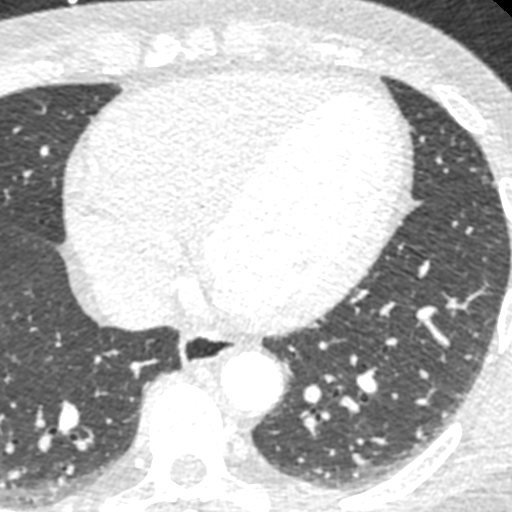
[im 2220/3330  vessel]
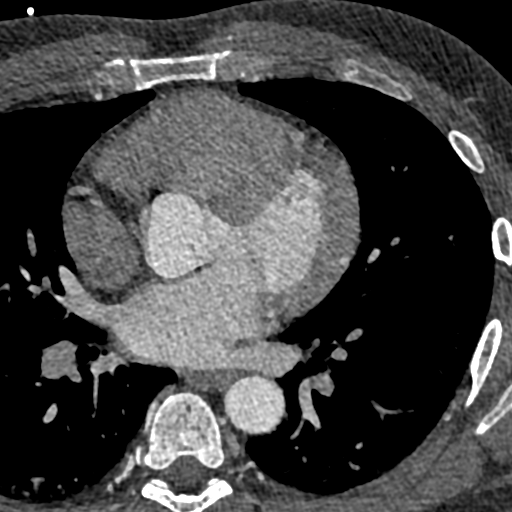

[2 of 20 positions shown; findings below may reference images not displayed]

FINDINGS: A retrospective scan was triggered in the descending thoracic aorta.
Axial non-contrast 3 mm slices were carried out through the heart.
The data set was analyzed on a dedicated work station and scored
using the Agatson method. Gantry rotation speed was 330 msecs and
collimation was .6 mm. 0.8 mg of sl NTG was given. The 3D data set
was reconstructed in 5% intervals of the 50-95 % of the R-R cycle.
Diastolic phases were analyzed on a dedicated work station using
MPR, MIP and VRT modes. The patient received 100 cc of contrast.

Aorta:  Normal size.  No calcifications.  No dissection.

Aortic Valve:  Trileaflet.  No calcifications.

Coronary Arteries:  Normal coronary origin.  Right dominance.

RCA is a large dominant artery that gives rise to PDA and PLA. There
is no plaque.

Left main is a large artery that gives rise to LAD, Ramus
intermedius and LCX arteries.

The Ramus is a large branch with no plaque.

LAD is a large vessel that has no plaque.

LCX is a small non-dominant artery.  There is no plaque.

Other findings:

Normal pulmonary vein drainage into the left atrium.

Normal left atrial appendage without a thrombus.

Normal size of the pulmonary artery.
IMPRESSION: 1. Coronary calcium score of 0.

2. Normal coronary origin with right dominance.

3. No evidence of CAD.

4. CAD-RADS 0. No evidence of CAD (0%). Consider non-atherosclerotic
causes of chest pain.

ADDENDUM:
OVER-READ INTERPRETATION  CT CHEST

The following report is an over-read performed by radiologist Dr.
does not include interpretation of cardiac or coronary anatomy or
pathology. The coronary CTA interpretation by the cardiologist is
attached.
FINDINGS: Cardiovascular: Normal aortic caliber. No central pulmonary
embolism, on this non-dedicated study.

Mediastinum/Nodes: No imaged thoracic adenopathy.

Lungs/Pleura: No pleural fluid.  Clear imaged lungs.

Upper Abdomen: Normal imaged portions of the liver, spleen.

Musculoskeletal: No acute osseous abnormality.
IMPRESSION: No acute findings in the imaged extracardiac chest.

*** End of Addendum ***
FINDINGS: A retrospective scan was triggered in the descending thoracic aorta.
Axial non-contrast 3 mm slices were carried out through the heart.
The data set was analyzed on a dedicated work station and scored
using the Agatson method. Gantry rotation speed was 330 msecs and
collimation was .6 mm. 0.8 mg of sl NTG was given. The 3D data set
was reconstructed in 5% intervals of the 50-95 % of the R-R cycle.
Diastolic phases were analyzed on a dedicated work station using
MPR, MIP and VRT modes. The patient received 100 cc of contrast.

Aorta:  Normal size.  No calcifications.  No dissection.

Aortic Valve:  Trileaflet.  No calcifications.

Coronary Arteries:  Normal coronary origin.  Right dominance.

RCA is a large dominant artery that gives rise to PDA and PLA. There
is no plaque.

Left main is a large artery that gives rise to LAD, Ramus
intermedius and LCX arteries.

The Ramus is a large branch with no plaque.

LAD is a large vessel that has no plaque.

LCX is a small non-dominant artery.  There is no plaque.

Other findings:

Normal pulmonary vein drainage into the left atrium.

Normal left atrial appendage without a thrombus.

Normal size of the pulmonary artery.
IMPRESSION: 1. Coronary calcium score of 0.

2. Normal coronary origin with right dominance.

3. No evidence of CAD.

4. CAD-RADS 0. No evidence of CAD (0%). Consider non-atherosclerotic
causes of chest pain.

## 2020-05-16 ENCOUNTER — Encounter: Payer: Self-pay | Admitting: Emergency Medicine

## 2020-05-16 ENCOUNTER — Other Ambulatory Visit: Payer: Self-pay

## 2020-05-16 ENCOUNTER — Emergency Department: Payer: Self-pay

## 2020-05-16 DIAGNOSIS — Z5321 Procedure and treatment not carried out due to patient leaving prior to being seen by health care provider: Secondary | ICD-10-CM | POA: Insufficient documentation

## 2020-05-16 DIAGNOSIS — R079 Chest pain, unspecified: Secondary | ICD-10-CM | POA: Insufficient documentation

## 2020-05-16 DIAGNOSIS — R0602 Shortness of breath: Secondary | ICD-10-CM | POA: Insufficient documentation

## 2020-05-16 LAB — COMPREHENSIVE METABOLIC PANEL
ALT: 18 U/L (ref 0–44)
AST: 20 U/L (ref 15–41)
Albumin: 4.4 g/dL (ref 3.5–5.0)
Alkaline Phosphatase: 47 U/L (ref 38–126)
Anion gap: 11 (ref 5–15)
BUN: 13 mg/dL (ref 6–20)
CO2: 25 mmol/L (ref 22–32)
Calcium: 9.3 mg/dL (ref 8.9–10.3)
Chloride: 102 mmol/L (ref 98–111)
Creatinine, Ser: 1.16 mg/dL (ref 0.61–1.24)
GFR calc Af Amer: >60 mL/min (ref >60)
GFR calc non Af Amer: >60 mL/min (ref >60)
Glucose, Bld: 114 mg/dL — ABNORMAL HIGH (ref 70–99)
Potassium: 3.9 mmol/L (ref 3.5–5.1)
Sodium: 138 mmol/L (ref 135–145)
Total Bilirubin: 0.9 mg/dL (ref 0.3–1.2)
Total Protein: 7.5 g/dL (ref 6.5–8.1)

## 2020-05-16 LAB — CBC
HCT: 41.3 % (ref 39.0–52.0)
Hemoglobin: 14.4 g/dL (ref 13.0–17.0)
MCH: 26.7 pg (ref 26.0–34.0)
MCHC: 34.9 g/dL (ref 30.0–36.0)
MCV: 76.5 fL — ABNORMAL LOW (ref 80.0–100.0)
Platelets: 275 10*3/uL (ref 150–400)
RBC: 5.4 MIL/uL (ref 4.22–5.81)
RDW: 13.2 % (ref 11.5–15.5)
WBC: 10.2 10*3/uL (ref 4.0–10.5)
nRBC: 0 % (ref 0.0–0.2)

## 2020-05-16 LAB — TROPONIN I (HIGH SENSITIVITY)
Troponin I (High Sensitivity): 5 ng/L (ref <18)
Troponin I (High Sensitivity): 5 ng/L (ref <18)

## 2020-05-16 NOTE — ED Triage Notes (Signed)
Here for onset of SHOB and chest pain while driving home from work about one hour ago.  Pain is to left side of chest. Also now having chills. VSS. No increased WOB at this time.

## 2020-05-16 NOTE — ED Notes (Signed)
Pt states they did not leave, still sitting outside.

## 2020-05-17 ENCOUNTER — Ambulatory Visit: Payer: Self-pay

## 2020-05-17 ENCOUNTER — Emergency Department
Admission: EM | Admit: 2020-05-17 | Discharge: 2020-05-17 | Disposition: A | Discharge status: Home / Self Care | Payer: Self-pay | Attending: Emergency Medicine | Admitting: Emergency Medicine

## 2020-05-17 DIAGNOSIS — R202 Paresthesia of skin: Secondary | ICD-10-CM | POA: Diagnosis not present

## 2020-05-17 DIAGNOSIS — Z566 Other physical and mental strain related to work: Secondary | ICD-10-CM | POA: Diagnosis not present

## 2020-05-17 DIAGNOSIS — R079 Chest pain, unspecified: Secondary | ICD-10-CM | POA: Diagnosis not present

## 2020-05-17 DIAGNOSIS — R0789 Other chest pain: Secondary | ICD-10-CM | POA: Diagnosis not present

## 2020-05-17 DIAGNOSIS — R001 Bradycardia, unspecified: Secondary | ICD-10-CM | POA: Diagnosis not present

## 2020-05-17 DIAGNOSIS — Z20822 Contact with and (suspected) exposure to covid-19: Secondary | ICD-10-CM | POA: Diagnosis not present

## 2020-05-17 NOTE — ED Notes (Signed)
Pt not found in lobby or outside  

## 2020-05-17 NOTE — Telephone Encounter (Addendum)
Pt. Went to ED last night for chest pain. States the staff thought he had left and "put me back on the bottom of the list to be seen. I did have some lab work done." Left ED.No pain this morning.Last night pain was on left side of chest and jaw. Had dizziness and chills.Some shortness of breath. Requests appointment today. PCP has no availability per [facility] agent.Practice currently closed.Please advise pt.Instructed to go back to ED if chest pain returns.  Answer Assessment - Initial Assessment Questions 1. LOCATION: "Where does it hurt?"       Left side and breast - Sharp pain 2. RADIATION: "Does the pain go anywhere else?" (e.g., into neck, jaw, arms, back)     Jaw 3. ONSET: "When did the chest pain begin?" (Minutes, hours or days)      Yesterday 4:45 4. PATTERN "Does the pain come and go, or has it been constant since it started?"  "Does it get worse with exertion?"      Comes and goes. 5. DURATION: "How long does it last" (e.g., seconds, minutes, hours)     Hours 6. SEVERITY: "How bad is the pain?"  (e.g., Scale 1-10; mild, moderate, or severe)    - MILD (1-3): doesn't interfere with normal activities     - MODERATE (4-7): interferes with normal activities or awakens from sleep    - SEVERE (8-10): excruciating pain, unable to do any normal activities       4-5 7. CARDIAC RISK FACTORS: "Do you have any history of heart problems or risk factors for heart disease?" (e.g., angina, prior heart attack; diabetes, high blood pressure, high cholesterol, smoker, or strong family history of heart disease)     No 8. PULMONARY RISK FACTORS: "Do you have any history of lung disease?"  (e.g., blood clots in lung, asthma, emphysema, birth control pills)     No 9. CAUSE: "What do you think is causing the chest pain?"     Unsure 10. OTHER SYMPTOMS: "Do you have any other symptoms?" (e.g., dizziness, nausea, vomiting, sweating, fever, difficulty breathing, cough)       Chills, Dizzy 11. PREGNANCY: "Is there  any chance you are pregnant?" "When was your last menstrual period?"       n/a  Protocols used: CHEST PAIN-A-AH

## 2020-05-17 NOTE — Telephone Encounter (Signed)
Called pt to schedule apt.  He states his chest pain returned and went to [facility] ER.   Thanks,   -Vernona Rieger

## 2020-05-21 ENCOUNTER — Other Ambulatory Visit: Payer: Self-pay

## 2020-05-21 ENCOUNTER — Ambulatory Visit (INDEPENDENT_AMBULATORY_CARE_PROVIDER_SITE_OTHER): Payer: BC Managed Care – PPO | Admitting: Physician Assistant

## 2020-05-21 ENCOUNTER — Encounter: Payer: Self-pay | Admitting: Physician Assistant

## 2020-05-21 VITALS — BP 119/79 | HR 82 | Temp 97.8°F | Resp 16 | Wt 220.4 lb

## 2020-05-21 DIAGNOSIS — Z23 Encounter for immunization: Secondary | ICD-10-CM | POA: Diagnosis not present

## 2020-05-21 DIAGNOSIS — R002 Palpitations: Secondary | ICD-10-CM

## 2020-05-21 NOTE — Progress Notes (Signed)
Established patient visit   Patient: Vincent Clark   DOB: 1976/05/04   44 y.o. Male  MRN: [id number] Visit Date: 05/21/2020  Today's healthcare provider: Margaretann Loveless, PA-C   Chief Complaint  Patient presents with   ER follow up   Subjective    HPI  Follow up ER visit  Patient was seen in ER for Chest Pain on 05/17/20 Treatment for this included EKG, chest x-ray and blood test He reports good compliance with treatment. He reports this condition is Improved.  Reports he was driving home from work and had to pull over on the side of the road because he developed severe chest pain and SOB. Had to get out of his car and walk around. Pain subsided enough he was able to get home and have his wife drive him to the ER.   Since ER he has had this occur a few more times with palpitations and feels like his heart is beating irregular. On subsequent times it has not been as severe. No known family history of arrhythmias.   Patient has had cardiac evaluation earlier this year, February 2021, with Dr. Kirke Corin. He underwent cardiac CT that had coronary calcium score of 0. -----------------------------------------------------------------------------------------  Patient Active Problem List   Diagnosis Date Noted   Right lower quadrant abdominal pain 01/05/2020   History of systemic reaction to bee sting 06/29/2015   History reviewed. No pertinent past medical history.     Medications: Outpatient Medications Prior to Visit  Medication Sig   dicyclomine (BENTYL) 10 MG capsule Take 1 capsule (10 mg total) by mouth 4 (four) times daily -  before meals and at bedtime.   Multiple Vitamin (MULTIVITAMIN ADULT PO) Take 2 capsules by mouth daily.   NON FORMULARY Take 3 capsules by mouth daily. Nugenix   omeprazole (PRILOSEC) 20 MG capsule Take 1 capsule (20 mg total) by mouth 2 (two) times daily before a meal.   triamcinolone cream (KENALOG) 0.1 % Apply 1 application topically 2  (two) times daily.   No facility-administered medications prior to visit.    Review of Systems  Constitutional: Negative.   Respiratory: Positive for shortness of breath.   Cardiovascular: Positive for chest pain and palpitations.  Gastrointestinal: Negative.   Neurological: Negative.     Last CBC Lab Results  Component Value Date   WBC 10.2 05/16/2020   HGB 14.4 05/16/2020   HCT 41.3 05/16/2020   MCV 76.5 (L) 05/16/2020   MCH 26.7 05/16/2020   RDW 13.2 05/16/2020   PLT 275 05/16/2020   Last metabolic panel Lab Results  Component Value Date   GLUCOSE 114 (H) 05/16/2020   NA 138 05/16/2020   K 3.9 05/16/2020   CL 102 05/16/2020   CO2 25 05/16/2020   BUN 13 05/16/2020   CREATININE 1.16 05/16/2020   GFRNONAA >60 05/16/2020   GFRAA >60 05/16/2020   CALCIUM 9.3 05/16/2020   PROT 7.5 05/16/2020   ALBUMIN 4.4 05/16/2020   LABGLOB 2.6 01/05/2020   AGRATIO 1.8 01/05/2020   BILITOT 0.9 05/16/2020   ALKPHOS 47 05/16/2020   AST 20 05/16/2020   ALT 18 05/16/2020   ANIONGAP 11 05/16/2020      Objective    BP 119/79 (BP Location: Left Arm, Patient Position: Sitting, Cuff Size: Normal)    Pulse 82    Temp 97.8 F (36.6 C) (Oral)    Resp 16    Wt 220 lb 6.4 oz (100 kg)    BMI  32.55 kg/m  BP Readings from Last 3 Encounters:  05/21/20 119/79  05/17/20 (!) 148/99  01/23/20 123/79   Wt Readings from Last 3 Encounters:  05/21/20 220 lb 6.4 oz (100 kg)  05/16/20 203 lb (92.1 kg)  01/23/20 200 lb 6.4 oz (90.9 kg)      Physical Exam Vitals reviewed.  Constitutional:      General: He is not in acute distress.    Appearance: Normal appearance. He is well-developed. He is obese. He is not diaphoretic.  HENT:     Head: Normocephalic and atraumatic.  Cardiovascular:     Rate and Rhythm: Normal rate and regular rhythm.     Pulses: Normal pulses.     Heart sounds: Normal heart sounds. No murmur heard.  No friction rub. No gallop.   Pulmonary:     Effort: Pulmonary effort  is normal. No respiratory distress.     Breath sounds: Normal breath sounds. No wheezing or rales.  Musculoskeletal:     Cervical back: Normal range of motion and neck supple.     Right lower leg: No edema.     Left lower leg: No edema.  Neurological:     Mental Status: He is alert.       No results found for any visits on 05/21/20.  Assessment & Plan     1. Palpitations ER notes, lab results, EKG, and images from 05/17/20 personally reviewed by me prior to patient arrival today. Concerned of heart arrhythmia. I feel symptoms are more associated with anxiety and panic attacks. He is wishing for confirmation and peace of mind. I agree it is worthwhile to rule out arrhythmia. Will refer back to Dr. Jari Sportsman office for consideration of Holter monitor to r/o arrhythmias. Consult is appreciated. If negative work up patient can call to come and discuss anxiety and options for that if desired.  - Ambulatory referral to Cardiology  2. Need for influenza vaccination Flu vaccine given today without complication. Patient sat upright for 15 minutes to check for adverse reaction before being released. - Flu Vaccine QUAD 36+ mos IM   No follow-ups on file.      Delmer Islam, PA-C, have reviewed all documentation for this visit. The documentation on 05/24/20 for the exam, diagnosis, procedures, and orders are all accurate and complete.   Reine Just  [facility] [phone] (phone) [phone] (fax)  [facility]

## 2020-05-24 ENCOUNTER — Encounter: Payer: Self-pay | Admitting: Physician Assistant

## 2020-06-13 ENCOUNTER — Other Ambulatory Visit: Payer: Self-pay

## 2020-06-13 ENCOUNTER — Ambulatory Visit (INDEPENDENT_AMBULATORY_CARE_PROVIDER_SITE_OTHER): Payer: BC Managed Care – PPO | Admitting: Cardiology

## 2020-06-13 ENCOUNTER — Ambulatory Visit (INDEPENDENT_AMBULATORY_CARE_PROVIDER_SITE_OTHER): Payer: BC Managed Care – PPO

## 2020-06-13 ENCOUNTER — Encounter: Payer: Self-pay | Admitting: Cardiology

## 2020-06-13 DIAGNOSIS — R002 Palpitations: Secondary | ICD-10-CM

## 2020-06-13 NOTE — Progress Notes (Signed)
Electrophysiology Office Note:    Date:  06/13/2020   ID:  Vincent Clark, DOB Nov 16, 1975, MRN [id number]  PCP:  Margaretann Loveless, PA-C  [facility] Cardiologist:  No primary care provider on file.  [facility] Electrophysiologist:  None   Referring MD: Suella Grove*   Chief Complaint: Palpitations  History of Present Illness:    Vincent Clark is a 44 y.o. male who presents for an evaluation of palpitations at the request of Eliezer Bottom, PA-C.  No significant past medical history.  Patient tells me that he was driving his car and experienced sudden onset of an abnormal feeling in his chest.  He tells me he feels like his heart was fluttering.  He also felt like a tingling sensation was going down both his arms and he had a chest pressure.  He also felt chills.  He was concerned enough that he pulled over to the side of the highway and got out of his car and paced up and down the side of the road until his symptoms improved.  He tells me he is really never had this set of symptoms before.  He has previously been evaluated by Dr. Kirke Corin in February 2021.  Cardiac CT at that time showed a coronary artery calcium score of 0.  The males in his family have significant cardiovascular disease.  No past medical history on file.  Past Surgical History:  Procedure Laterality Date  . HERNIA REPAIR  2004    Current Medications: Current Meds  Medication Sig  . dicyclomine (BENTYL) 10 MG capsule Take 1 capsule (10 mg total) by mouth 4 (four) times daily -  before meals and at bedtime.  . Multiple Vitamin (MULTIVITAMIN ADULT PO) Take 2 capsules by mouth daily.  . NON FORMULARY Take 3 capsules by mouth daily. Nugenix     Allergies:   Other   Social History   Socioeconomic History  . Marital status: Married    Spouse name: Not on file  . Number of children: Not on file  . Years of education: Not on file  . Highest education level: Not on file  Occupational History  .  Not on file  Tobacco Use  . Smoking status: Former Games developer  . Smokeless tobacco: Never Used  . Tobacco comment: one month ago. 09/19/15  Vaping Use  . Vaping Use: Former  Substance and Sexual Activity  . Alcohol use: Yes    Alcohol/week: 8.0 standard drinks    Types: 8 Shots of liquor per week    Comment: daily  . Drug use: Yes    Types: Marijuana  . Sexual activity: Yes  Other Topics Concern  . Not on file  Social History Narrative  . Not on file   Social Determinants of Health   Financial Resource Strain:   . Difficulty of Paying Living Expenses: Not on file  Food Insecurity:   . Worried About Programme researcher, broadcasting/film/video in the Last Year: Not on file  . Ran Out of Food in the Last Year: Not on file  Transportation Needs:   . Lack of Transportation (Medical): Not on file  . Lack of Transportation (Non-Medical): Not on file  Physical Activity:   . Days of Exercise per Week: Not on file  . Minutes of Exercise per Session: Not on file  Stress:   . Feeling of Stress : Not on file  Social Connections:   . Frequency of Communication with Friends and Family: Not on file  .  Frequency of Social Gatherings with Friends and Family: Not on file  . Attends Religious Services: Not on file  . Active Member of Clubs or Organizations: Not on file  . Attends Banker Meetings: Not on file  . Marital Status: Not on file     Family History: The patient's family history includes Asthma in his brother; Bipolar disorder in his father and maternal uncle; Diabetes in his maternal grandmother and mother; Heart disease in his father; Mental illness in his maternal uncle.  ROS:   Please see the history of present illness.    All other systems reviewed and are negative.  EKGs/Labs/Other Studies Reviewed:    The following studies were reviewed today: Prior records  EKG:  The ekg ordered today demonstrates sinus bradycardia.  Recent Labs: 08/29/2019: TSH 0.837 05/16/2020: ALT 18; BUN  13; Creatinine, Ser 1.16; Hemoglobin 14.4; Platelets 275; Potassium 3.9; Sodium 138  Recent Lipid Panel    Component Value Date/Time   CHOL 131 08/29/2019 0827   TRIG 63 08/29/2019 0827   HDL 55 08/29/2019 0827   CHOLHDL 2.4 08/29/2019 0827   LDLCALC 63 08/29/2019 0827    Physical Exam:    VS:  BP (!) 158/80   Pulse (!) 43   Ht 5\' 9"  (1.753 m)   Wt 202 lb (91.6 kg)   SpO2 99%   BMI 29.83 kg/m     Wt Readings from Last 3 Encounters:  06/13/20 202 lb (91.6 kg)  05/21/20 220 lb 6.4 oz (100 kg)  05/16/20 203 lb (92.1 kg)     GEN:  Well nourished, well developed in no acute distress HEENT: Normal NECK: No JVD; No carotid bruits LYMPHATICS: No lymphadenopathy CARDIAC: RRR, no murmurs, rubs, gallops RESPIRATORY:  Clear to auscultation without rales, wheezing or rhonchi  ABDOMEN: Soft, non-tender, non-distended MUSCULOSKELETAL:  No edema; No deformity  SKIN: Warm and dry NEUROLOGIC:  Alert and oriented x 3 PSYCHIATRIC:  Normal affect   ASSESSMENT:    1. Palpitations    PLAN:    In order of problems listed above:  1. Palpitations Patient's set of symptoms seem most compatible with a noncardiac cause such as anxiety.  Given his age and family history I think it is reasonable to monitor his heart rhythm using a 7-day wearable patch monitor.  I will also do a exercise treadmill test.  2.  Elevated blood pressure recording Close follow-up with his primary care physician.   Medication Adjustments/Labs and Tests Ordered: Current medicines are reviewed at length with the patient today.  Concerns regarding medicines are outlined above.  Orders Placed This Encounter  Procedures  . Exercise Tolerance Test  . LONG TERM MONITOR (3-14 DAYS)  . EKG 12-Lead   No orders of the defined types were placed in this encounter.    Signed, 05/18/20, MD, [facility]  06/13/2020 10:52 AM    Electrophysiology [facility]

## 2020-06-13 NOTE — Patient Instructions (Addendum)
Medication Instructions:  Your physician recommends that you continue on your current medications as directed. Please refer to the Current Medication list given to you today. *If you need a refill on your cardiac medications before your next appointment, please call your pharmacy*  Lab Work: None ordered. If you have labs (blood work) drawn today and your tests are completely normal, you will receive your results only by: Marland Kitchen MyChart Message (if you have MyChart) OR . A paper copy in the mail If you have any lab test that is abnormal or we need to change your treatment, we will call you to review the results.  Testing/Procedures: Your physician has requested that you have an exercise tolerance test.  Please schedule for exercise treadmill test.  Your physician has recommended that you wear a holter monitor. Holter monitors are medical devices that record the heart's electrical activity. Doctors most often use these monitors to diagnose arrhythmias. Arrhythmias are problems with the speed or rhythm of the heartbeat. The monitor is a small, portable device. You can wear one while you do your normal daily activities. This is usually used to diagnose what is causing palpitations/syncope (passing out).  You will wear a 7 day ZIO   Follow-Up: At [facility], you and your health needs are our priority.  As part of our continuing mission to provide you with exceptional heart care, we have created designated Provider Care Teams.  These Care Teams include your primary Cardiologist (physician) and Advanced Practice Providers (APPs -  Physician Assistants and Nurse Practitioners) who all work together to provide you with the care you need, when you need it.  Your next appointment:   Your physician wants you to follow-up in: 6 weeks with Dr. Lalla Brothers at the [facility] office

## 2020-06-19 ENCOUNTER — Other Ambulatory Visit: Payer: PRIVATE HEALTH INSURANCE | Attending: Cardiology

## 2020-07-06 ENCOUNTER — Other Ambulatory Visit
Admission: RE | Admit: 2020-07-06 | Discharge: 2020-07-06 | Disposition: A | Discharge status: Home / Self Care | Payer: BC Managed Care – PPO | Source: Ambulatory Visit | Attending: Cardiology | Admitting: Cardiology

## 2020-07-06 ENCOUNTER — Other Ambulatory Visit: Payer: Self-pay

## 2020-07-06 DIAGNOSIS — Z20822 Contact with and (suspected) exposure to covid-19: Secondary | ICD-10-CM | POA: Diagnosis not present

## 2020-07-06 DIAGNOSIS — Z01812 Encounter for preprocedural laboratory examination: Secondary | ICD-10-CM | POA: Insufficient documentation

## 2020-07-06 LAB — SARS CORONAVIRUS 2 (TAT 6-24 HRS): SARS Coronavirus 2: NEGATIVE

## 2020-07-09 ENCOUNTER — Ambulatory Visit (INDEPENDENT_AMBULATORY_CARE_PROVIDER_SITE_OTHER): Payer: BC Managed Care – PPO

## 2020-07-09 ENCOUNTER — Other Ambulatory Visit: Payer: Self-pay

## 2020-07-09 DIAGNOSIS — R002 Palpitations: Secondary | ICD-10-CM

## 2020-07-11 LAB — EXERCISE TOLERANCE TEST
Estimated workload: 11.9 METS
Exercise duration (min): 10 min
Exercise duration (sec): 9 s
MPHR: 179 {beats}/min
Peak HR: 166 {beats}/min
Percent HR: 92 %
RPE: 13
Rest HR: 65 {beats}/min

## 2020-07-25 ENCOUNTER — Encounter: Payer: Self-pay | Admitting: Cardiology

## 2020-07-25 ENCOUNTER — Ambulatory Visit (INDEPENDENT_AMBULATORY_CARE_PROVIDER_SITE_OTHER): Payer: BC Managed Care – PPO | Admitting: Cardiology

## 2020-07-25 ENCOUNTER — Other Ambulatory Visit: Payer: Self-pay

## 2020-07-25 VITALS — BP 112/70 | HR 50 | Ht 69.0 in | Wt 207.0 lb

## 2020-07-25 DIAGNOSIS — R002 Palpitations: Secondary | ICD-10-CM

## 2020-07-25 NOTE — Progress Notes (Signed)
Electrophysiology Office Follow up Visit Note:    Date:  07/25/2020   ID:  Vincent Clark, DOB 02/23/1976, MRN [id number]  PCP:  Margaretann Loveless, PA-C  [facility] Cardiologist:  No primary care provider on file.  [facility] Electrophysiologist:  None    Interval History:    Vincent Clark is a 44 y.o. male who presents for a follow up visit. They were last seen in clinic June 13, 2020 for palpitations.  At that appointment it was felt that his symptoms were most likely a noncardiac cause such as anxiety.  A 7-day patch monitor was prescribed.  An exercise tolerance test was also ordered.  Since their last appointment, he has been doing well.  He did have 1 recurrent episode of what sounds like an anxiety attack.  He tells me that he was feeling anxious felt his heart start to race which then caused more more shortness of breath.  He recognized what was going on he was able to stop and focus on his breathing and he calmed down and the symptoms passed.  He tells me he had another episode of feeling palpitations while he was smoking marijuana.     History reviewed. No pertinent past medical history.  Past Surgical History:  Procedure Laterality Date  . HERNIA REPAIR  2004    Current Medications: Current Meds  Medication Sig  . dicyclomine (BENTYL) 10 MG capsule Take 1 capsule (10 mg total) by mouth 4 (four) times daily -  before meals and at bedtime.  . Multiple Vitamin (MULTIVITAMIN ADULT PO) Take 2 capsules by mouth daily.  . NON FORMULARY Take 3 capsules by mouth daily. Nugenix     Allergies:   Other   Social History   Socioeconomic History  . Marital status: Married    Spouse name: Not on file  . Number of children: Not on file  . Years of education: Not on file  . Highest education level: Not on file  Occupational History  . Not on file  Tobacco Use  . Smoking status: Former Games developer  . Smokeless tobacco: Never Used  . Tobacco comment: one month ago.  09/19/15  Vaping Use  . Vaping Use: Former  Substance and Sexual Activity  . Alcohol use: Yes    Alcohol/week: 8.0 standard drinks    Types: 8 Shots of liquor per week    Comment: daily  . Drug use: Yes    Types: Marijuana  . Sexual activity: Yes  Other Topics Concern  . Not on file  Social History Narrative  . Not on file   Social Determinants of Health   Financial Resource Strain:   . Difficulty of Paying Living Expenses: Not on file  Food Insecurity:   . Worried About Programme researcher, broadcasting/film/video in the Last Year: Not on file  . Ran Out of Food in the Last Year: Not on file  Transportation Needs:   . Lack of Transportation (Medical): Not on file  . Lack of Transportation (Non-Medical): Not on file  Physical Activity:   . Days of Exercise per Week: Not on file  . Minutes of Exercise per Session: Not on file  Stress:   . Feeling of Stress : Not on file  Social Connections:   . Frequency of Communication with Friends and Family: Not on file  . Frequency of Social Gatherings with Friends and Family: Not on file  . Attends Religious Services: Not on file  . Active Member of Clubs or  Organizations: Not on file  . Attends Banker Meetings: Not on file  . Marital Status: Not on file     Family History: The patient's family history includes Asthma in his brother; Bipolar disorder in his father and maternal uncle; Diabetes in his maternal grandmother and mother; Heart disease in his father; Mental illness in his maternal uncle.  ROS:   Please see the history of present illness.    All other systems reviewed and are negative.  EKGs/Labs/Other Studies Reviewed:    The following studies were reviewed today: Exercise tolerance test, ZIO, prior notes  July 11, 2020 exercise tolerance test personally reviewed  Blood pressure demonstrated a normal response to exercise.   Good exercise tolerance. No evidence of ischemia. No sustained arrhythmias during stress or  recovery. Rare PVCs.    June 13, 2020 7-day monitor personally reviewed HR 42-150, average 68bpm. Rare supraventricular and ventricular ectopy. No arrhythmias.      EKG:  The ekg ordered today demonstrates sinus bradycardia.  Normal intervals.  No preexcitation.  Recent Labs: 08/29/2019: TSH 0.837 05/16/2020: ALT 18; BUN 13; Creatinine, Ser 1.16; Hemoglobin 14.4; Platelets 275; Potassium 3.9; Sodium 138  Recent Lipid Panel    Component Value Date/Time   CHOL 131 08/29/2019 0827   TRIG 63 08/29/2019 0827   HDL 55 08/29/2019 0827   CHOLHDL 2.4 08/29/2019 0827   LDLCALC 63 08/29/2019 0827    Physical Exam:    VS:  BP 112/70   Pulse (!) 50   Ht 5\' 9"  (1.753 m)   Wt 207 lb (93.9 kg)   SpO2 99%   BMI 30.57 kg/m     Wt Readings from Last 3 Encounters:  07/25/20 207 lb (93.9 kg)  06/13/20 202 lb (91.6 kg)  05/21/20 220 lb 6.4 oz (100 kg)     GEN:  Well nourished, well developed in no acute distress HEENT: Normal NECK: No JVD; No carotid bruits LYMPHATICS: No lymphadenopathy CARDIAC: RRR, no murmurs, rubs, gallops RESPIRATORY:  Clear to auscultation without rales, wheezing or rhonchi  ABDOMEN: Soft, non-tender, non-distended MUSCULOSKELETAL:  No edema; No deformity  SKIN: Warm and dry NEUROLOGIC:  Alert and oriented x 3 PSYCHIATRIC:  Normal affect   ASSESSMENT:    1. Palpitations    PLAN:    In order of problems listed above:  1. Palpitations Likely feeling rare PVCs or PACs.  No sustained arrhythmias.  Exercise tolerance test normal.  Also suspect there is an element of anxiety contributing to his symptoms.  Recommend follow-up with primary care physician.  Follow-up as needed.   Medication Adjustments/Labs and Tests Ordered: Current medicines are reviewed at length with the patient today.  Concerns regarding medicines are outlined above.  No orders of the defined types were placed in this encounter.  No orders of the defined types were placed in  this encounter.    Signed, 07/21/20, MD, [facility]  07/25/2020 10:20 AM    Electrophysiology [facility]

## 2020-07-25 NOTE — Patient Instructions (Addendum)

## 2020-08-03 NOTE — Addendum Note (Signed)
Addended by: Kendrick Fries on: 08/03/2020 02:24 PM   Modules accepted: Orders

## 2020-08-20 IMAGING — US US ABDOMEN LIMITED
1 series · 14 of 25 positions shown · non-contrast
Comparison: CT abdomen pelvis 06/11/2018

CLINICAL DATA: Right upper quadrant pain mostly after eating

EXAM:
ULTRASOUND ABDOMEN LIMITED RIGHT UPPER QUADRANT

[Series 1: us abdomen limited · 0.19mm/px · 14 of 39 slices shown]
[im 1/39]
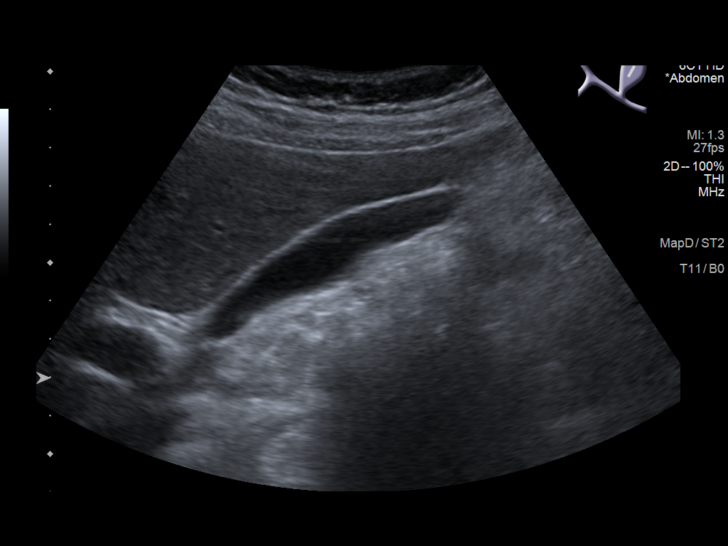
[im 4/39]
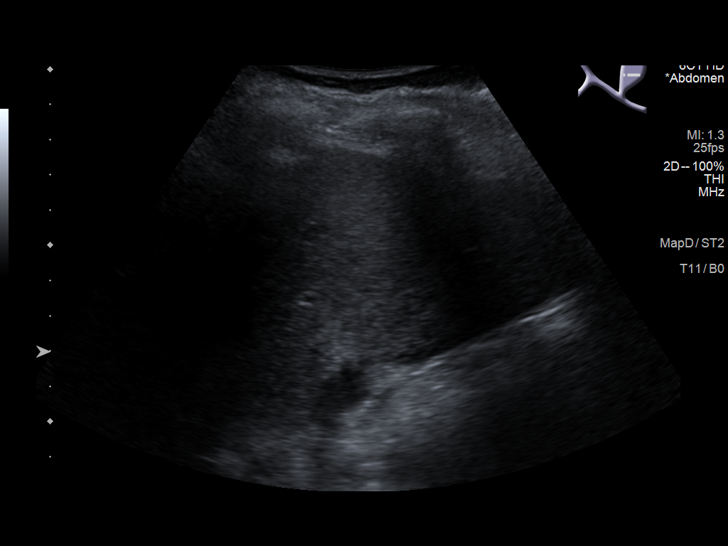
[im 7/39]
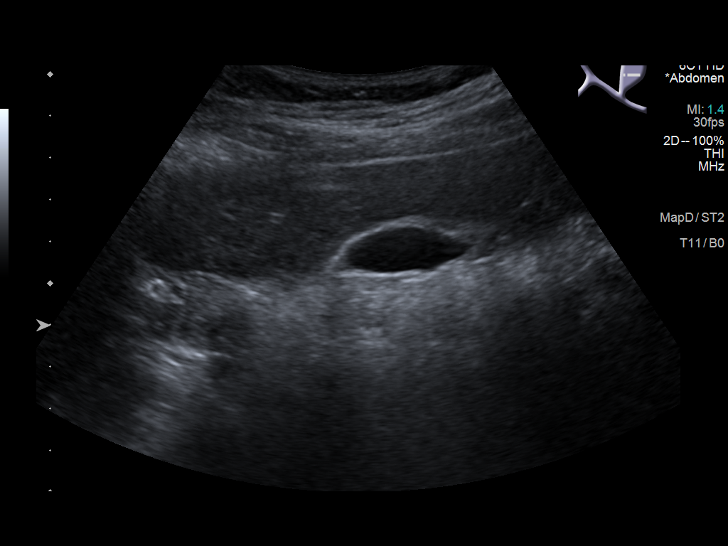
[im 10/39]
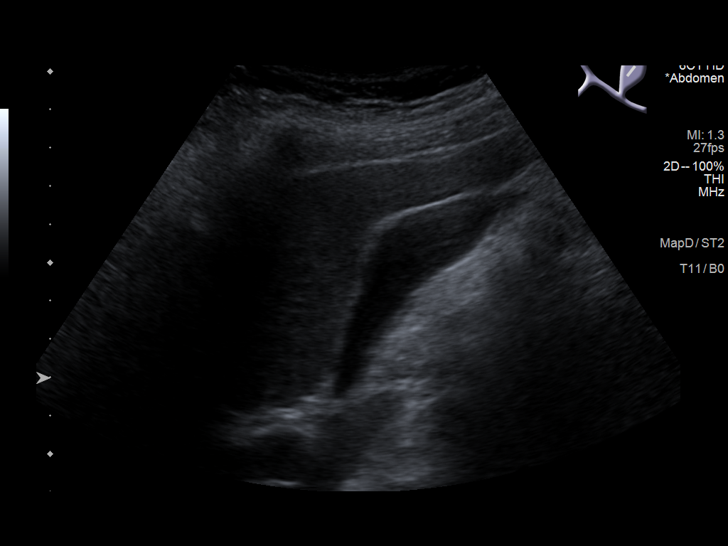
[im 13/39]
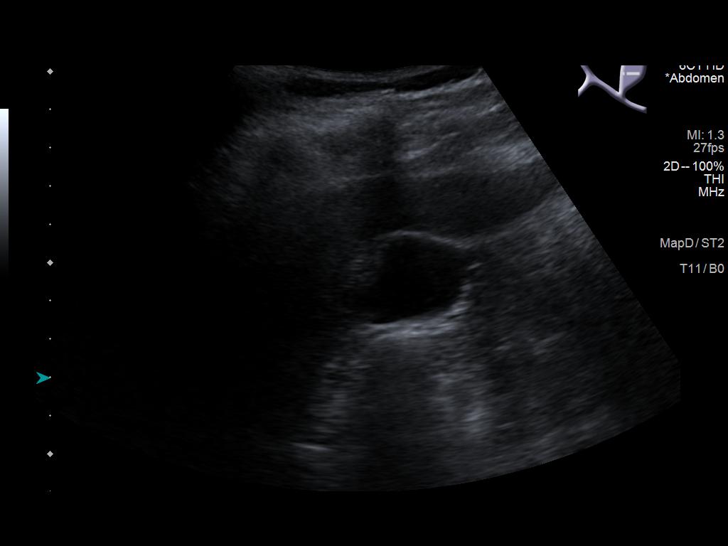
[im 15/39]
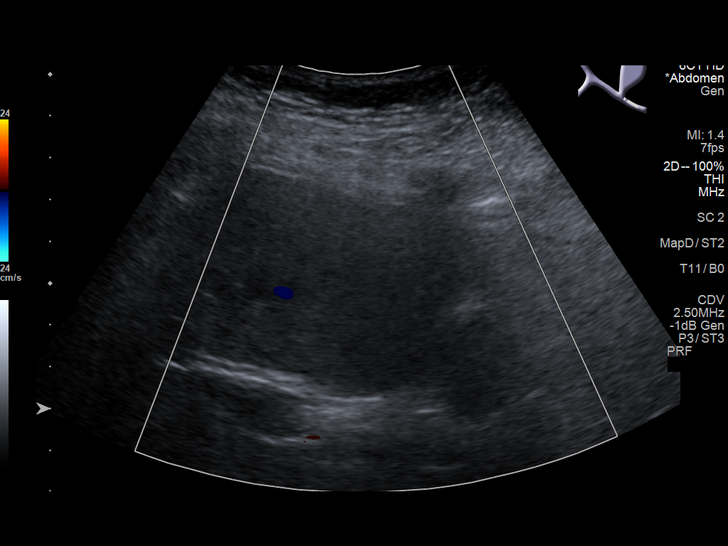
[im 18/39]
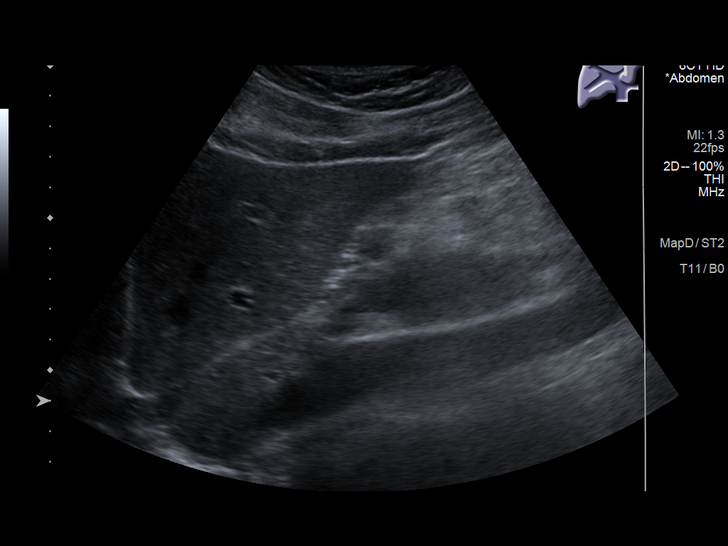
[im 21/39]
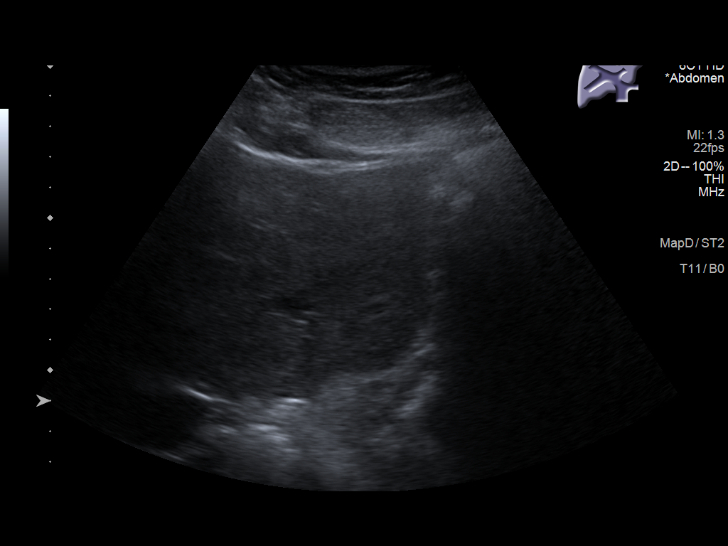
[im 24/39]
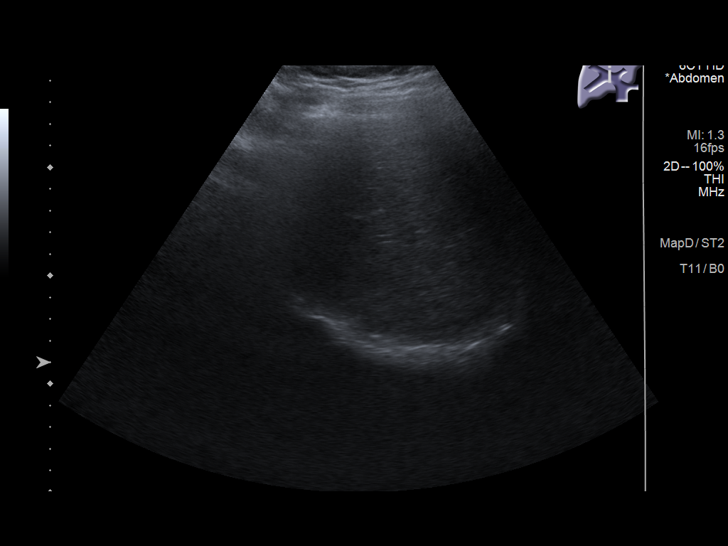
[im 26/39]
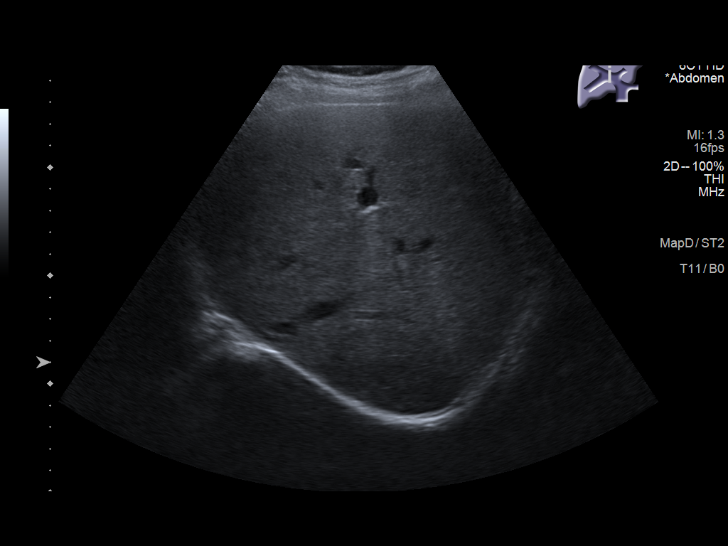
[im 29/39]
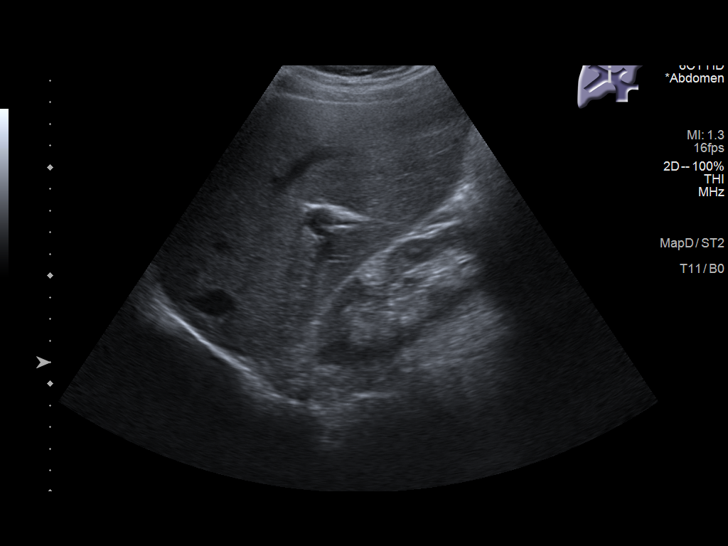
[im 32/39]
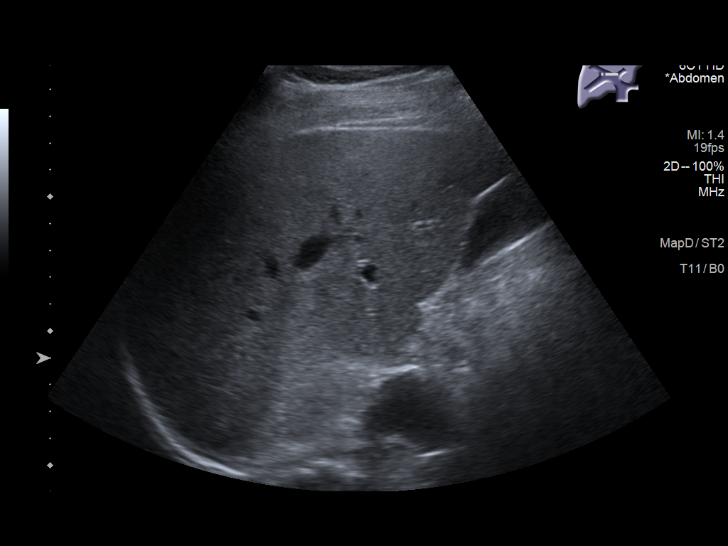
[im 35/39]
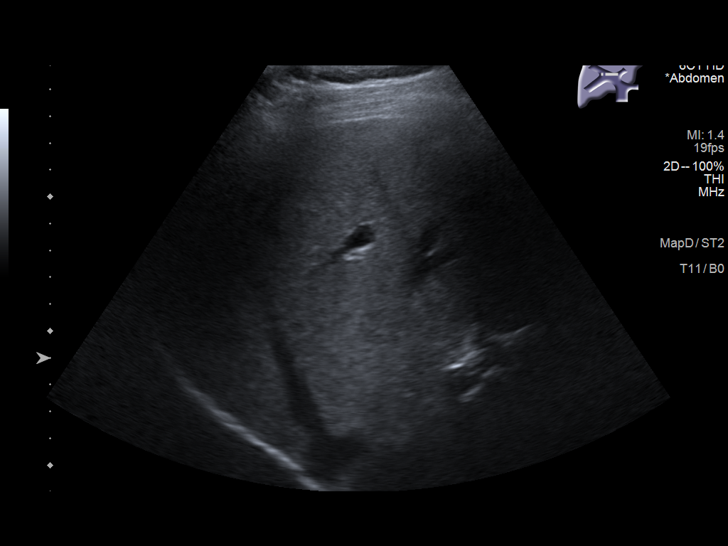
[im 39/39]
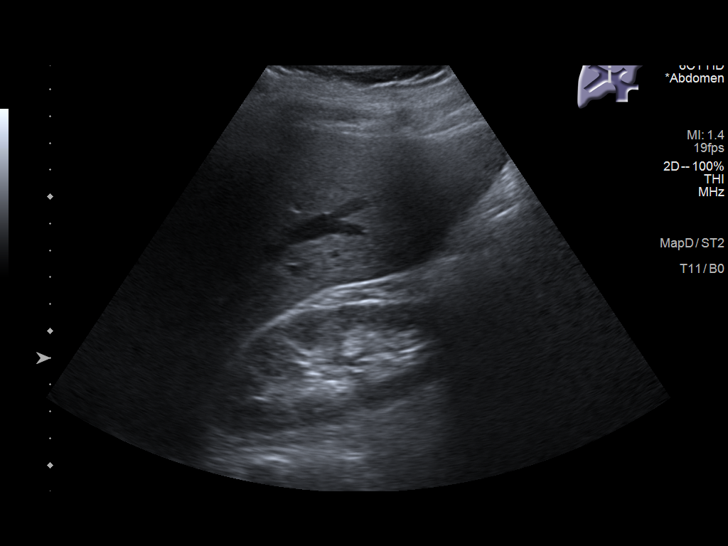

[14 of 25 positions shown; findings below may reference images not displayed]

FINDINGS: Gallbladder:

No gallstones or wall thickening visualized. No sonographic Murphy
sign noted by sonographer.

Common bile duct:

Diameter: 2.5 mm

Liver:

No focal lesion identified. Within normal limits in parenchymal
echogenicity. Portal vein is patent on color Doppler imaging with
normal direction of blood flow towards the liver.

Other: None.
IMPRESSION: Negative right upper quadrant ultrasound.

## 2020-08-23 DIAGNOSIS — Z1152 Encounter for screening for COVID-19: Secondary | ICD-10-CM | POA: Diagnosis not present

## 2020-08-23 DIAGNOSIS — Z03818 Encounter for observation for suspected exposure to other biological agents ruled out: Secondary | ICD-10-CM | POA: Diagnosis not present

## 2020-10-10 ENCOUNTER — Ambulatory Visit (INDEPENDENT_AMBULATORY_CARE_PROVIDER_SITE_OTHER): Payer: BC Managed Care – PPO | Admitting: Physician Assistant

## 2020-10-10 ENCOUNTER — Other Ambulatory Visit: Payer: Self-pay

## 2020-10-10 ENCOUNTER — Ambulatory Visit: Payer: BC Managed Care – PPO | Admitting: Physician Assistant

## 2020-10-10 ENCOUNTER — Encounter: Payer: Self-pay | Admitting: Physician Assistant

## 2020-10-10 VITALS — BP 128/87 | HR 62 | Temp 98.7°F | Wt 200.0 lb

## 2020-10-10 DIAGNOSIS — T148XXA Other injury of unspecified body region, initial encounter: Secondary | ICD-10-CM

## 2020-10-10 NOTE — Progress Notes (Signed)
Established patient visit   Patient: Vincent Clark   DOB: 04-27-1976   45 y.o. Male  MRN: [id number] Visit Date: 10/10/2020  Today's healthcare provider: Margaretann Loveless, PA-C   Chief Complaint  Patient presents with  . Bleeding/Bruising   Subjective    HPI  Pt coming in complaining abnormal bruising.  He first noticed it about four weeks ago.  The first one is located on his right backside, the second one appeared shortly after on his lower back right side.  He denies injury, pain, also no abnormal bleeding recently.  He has one episode of seeing blood in his stool a few weeks ago but that has completely resolved.    Patient Active Problem List   Diagnosis Date Noted  . Palpitations 06/13/2020  . Right lower quadrant abdominal pain 01/05/2020  . History of systemic reaction to bee sting 06/29/2015   No past medical history on file. Social History   Tobacco Use  . Smoking status: Former Games developer  . Smokeless tobacco: Never Used  . Tobacco comment: one month ago. 09/19/15  Vaping Use  . Vaping Use: Former  Substance Use Topics  . Alcohol use: Yes    Alcohol/week: 8.0 standard drinks    Types: 8 Shots of liquor per week    Comment: daily  . Drug use: Yes    Types: Marijuana   Allergies  Allergen Reactions  . Other     Other reaction(s): Other (See Comments) Bees/wasp/hornets- swelling/cant breathe     Medications: Outpatient Medications Prior to Visit  Medication Sig  . dicyclomine (BENTYL) 10 MG capsule Take 1 capsule (10 mg total) by mouth 4 (four) times daily -  before meals and at bedtime.  . Multiple Vitamin (MULTIVITAMIN ADULT PO) Take 2 capsules by mouth daily.  . NON FORMULARY Take 3 capsules by mouth daily. Nugenix   No facility-administered medications prior to visit.    Review of Systems  Constitutional: Negative.   Respiratory: Negative.   Cardiovascular: Negative.   Gastrointestinal: Positive for blood in stool (Only one time a few  weeks ago. ). Negative for abdominal distention, abdominal pain, anal bleeding, constipation, diarrhea, nausea, rectal pain and vomiting.  Musculoskeletal: Negative.   Neurological: Negative for dizziness, light-headedness and headaches.  Hematological: Negative for adenopathy. Bruises/bleeds easily.        Objective    BP 128/87 (BP Location: Left Arm, Patient Position: Sitting, Cuff Size: Large)   Pulse 62   Temp 98.7 F (37.1 C) (Oral)   Wt 200 lb (90.7 kg)   BMI 29.53 kg/m    Physical Exam Vitals reviewed.  Constitutional:      General: He is not in acute distress.    Appearance: Normal appearance. He is well-developed and well-nourished. He is not ill-appearing.  HENT:     Head: Normocephalic and atraumatic.  Eyes:     Extraocular Movements: EOM normal.     Conjunctiva/sclera: Conjunctivae normal.  Pulmonary:     Effort: Pulmonary effort is normal. No respiratory distress.  Musculoskeletal:     Cervical back: Normal range of motion and neck supple.  Skin:    Findings: Bruising present.       Neurological:     Mental Status: He is alert.  Psychiatric:        Mood and Affect: Mood and affect normal.        Behavior: Behavior normal.        Thought Content: Thought content normal.  Judgment: Judgment normal.      No results found for any visits on 10/10/20.  Assessment & Plan     1. Bruising DDx: anemia, platelet disorder, kidney or liver damage, lupus. Mother has lupus. He has sickle cell trait. Will check labs as below and f/u pending results. - CBC w/Diff/Platelet - Comprehensive Metabolic Panel (CMET) - Fe+TIBC+Fer - ANA,IFA RA Diag Pnl w/rflx Tit/Patn   No follow-ups on file.      Delmer Islam, PA-C, have reviewed all documentation for this visit. The documentation on 10/10/20 for the exam, diagnosis, procedures, and orders are all accurate and complete.   Reine Just  [facility] [phone]  (phone) [phone] (fax)  [facility]

## 2020-10-16 LAB — ANA,IFA RA DIAG PNL W/RFLX TIT/PATN
ANA Titer 1: NEGATIVE
Cyclic Citrullin Peptide Ab: 7 units (ref 0–19)
Rheumatoid fact SerPl-aCnc: <10 IU/mL (ref <14.0)

## 2020-10-16 LAB — COMPREHENSIVE METABOLIC PANEL
ALT: 20 IU/L (ref 0–44)
AST: 21 IU/L (ref 0–40)
Albumin/Globulin Ratio: 1.7 (ref 1.2–2.2)
Albumin: 4.5 g/dL (ref 4.0–5.0)
Alkaline Phosphatase: 63 IU/L (ref 44–121)
BUN/Creatinine Ratio: 10 (ref 9–20)
BUN: 10 mg/dL (ref 6–24)
Bilirubin Total: 1 mg/dL (ref 0.0–1.2)
CO2: 20 mmol/L (ref 20–29)
Calcium: 9.8 mg/dL (ref 8.7–10.2)
Chloride: 108 mmol/L — ABNORMAL HIGH (ref 96–106)
Creatinine, Ser: 1.01 mg/dL (ref 0.76–1.27)
GFR calc Af Amer: 106 mL/min/{1.73_m2} (ref >59)
GFR calc non Af Amer: 91 mL/min/{1.73_m2} (ref >59)
Globulin, Total: 2.7 g/dL (ref 1.5–4.5)
Glucose: 107 mg/dL — ABNORMAL HIGH (ref 65–99)
Potassium: 4.2 mmol/L (ref 3.5–5.2)
Sodium: 142 mmol/L (ref 134–144)
Total Protein: 7.2 g/dL (ref 6.0–8.5)

## 2020-10-16 LAB — CBC WITH DIFFERENTIAL/PLATELET
Basophils Absolute: 0.1 10*3/uL (ref 0.0–0.2)
Basos: 1 %
EOS (ABSOLUTE): 0.1 10*3/uL (ref 0.0–0.4)
Eos: 2 %
Hematocrit: 43.4 % (ref 37.5–51.0)
Hemoglobin: 14.9 g/dL (ref 13.0–17.7)
Immature Grans (Abs): 0 10*3/uL (ref 0.0–0.1)
Immature Granulocytes: 0 %
Lymphocytes Absolute: 2.5 10*3/uL (ref 0.7–3.1)
Lymphs: 35 %
MCH: 27.1 pg (ref 26.6–33.0)
MCHC: 34.3 g/dL (ref 31.5–35.7)
MCV: 79 fL (ref 79–97)
Monocytes Absolute: 0.7 10*3/uL (ref 0.1–0.9)
Monocytes: 9 %
Neutrophils Absolute: 3.7 10*3/uL (ref 1.4–7.0)
Neutrophils: 53 %
Platelets: 287 10*3/uL (ref 150–450)
RBC: 5.49 x10E6/uL (ref 4.14–5.80)
RDW: 13.1 % (ref 11.6–15.4)
WBC: 7.1 10*3/uL (ref 3.4–10.8)

## 2020-10-16 LAB — IRON,TIBC AND FERRITIN PANEL
Ferritin: 132 ng/mL (ref 30–400)
Iron Saturation: 24 % (ref 15–55)
Iron: 80 ug/dL (ref 38–169)
Total Iron Binding Capacity: 328 ug/dL (ref 250–450)
UIBC: 248 ug/dL (ref 111–343)

## 2020-11-12 ENCOUNTER — Telehealth: Payer: Self-pay

## 2020-11-12 DIAGNOSIS — N528 Other male erectile dysfunction: Secondary | ICD-10-CM

## 2020-11-12 MED ORDER — SILDENAFIL CITRATE 100 MG PO TABS: 50.0000 mg | ORAL_TABLET | Freq: Every day | ORAL | 11 refills | Status: DC | PRN

## 2020-11-12 NOTE — Telephone Encounter (Signed)
[facility] faxed refill request for the following medications:  Sildenafil 100 mg tab  I do not see this on pt's medication list.   Please advise.

## 2020-11-12 NOTE — Telephone Encounter (Signed)
refilled 

## 2020-11-26 ENCOUNTER — Telehealth: Payer: BC Managed Care – PPO | Admitting: Family Medicine

## 2020-11-30 ENCOUNTER — Telehealth: Payer: Self-pay

## 2020-11-30 NOTE — Telephone Encounter (Signed)
Patient was scheduled with Dr. Leonard Schwartz 04/22 for a virtual appt for Anxiety. Per patient he needs to be sooner than what Dr. B had. Offered appt with Dr. Sullivan Lone and Dr. Sherrie Mustache and he said his wife will call back to schedule the appointment.

## 2020-12-07 ENCOUNTER — Telehealth: Payer: BC Managed Care – PPO | Admitting: Family Medicine

## 2020-12-22 ENCOUNTER — Other Ambulatory Visit: Payer: Self-pay | Admitting: Physician Assistant

## 2020-12-22 DIAGNOSIS — N528 Other male erectile dysfunction: Secondary | ICD-10-CM

## 2020-12-24 NOTE — Telephone Encounter (Signed)
  Notes to clinic: Patient had appt on 12/07/2020 and it was canceled Review for refill    Requested Prescriptions  Pending Prescriptions Disp Refills   sildenafil (VIAGRA) 100 MG tablet [Pharmacy Med Name: Sildenafil Citrate 100mg  Tablet] 5 tablet 10    Sig: Take 1/2 to 1 tablet (50 mg to 100 mg total) by mouth daily as needed for erectile dysfunction.      Urology: Erectile Dysfunction Agents Passed - 12/22/2020  1:55 PM      Passed - Last BP in normal range    BP Readings from Last 1 Encounters:  10/10/20 128/87          Passed - Valid encounter within last 12 months    Recent Outpatient Visits           2 months ago Bruising   [facility] [place], [place], [place]   7 months ago Palpitations   [facility] [place], [place]   11 months ago Right sided abdominal pain   [facility], M   11 months ago Right lower quadrant abdominal pain   [facility] [place], [place], [place]   1 year ago Encounter for annual physical exam   [facility], [place]

## 2021-01-08 ENCOUNTER — Ambulatory Visit (INDEPENDENT_AMBULATORY_CARE_PROVIDER_SITE_OTHER): Payer: BC Managed Care – PPO | Admitting: Family Medicine

## 2021-01-08 ENCOUNTER — Encounter: Payer: Self-pay | Admitting: Family Medicine

## 2021-01-08 ENCOUNTER — Other Ambulatory Visit: Payer: Self-pay

## 2021-01-08 VITALS — BP 129/82 | HR 72 | Wt 218.0 lb

## 2021-01-08 DIAGNOSIS — Z125 Encounter for screening for malignant neoplasm of prostate: Secondary | ICD-10-CM

## 2021-01-08 DIAGNOSIS — R6882 Decreased libido: Secondary | ICD-10-CM

## 2021-01-08 NOTE — Progress Notes (Signed)
Established patient visit   Patient: Vincent Clark   DOB: 28-Sep-1975   45 y.o. Male  MRN: [id number] Visit Date: 01/08/2021  Today's healthcare provider: Dortha Kern, PA-C   No chief complaint on file.  Subjective    HPI  Patient is a 45 year old male who presents today for follow up of ED.  He reports no change in dietary habits or medications.  He has had no increase in stress but does admit to difficulty with his sleep.  States that he gets 4-5 hours of sleep per night.   He states the only change that has taken place is that he discontinued the use of all marijuana for 90 days.  He is still drinking 1 beer with 1-2 shots of liquor nightly.  No past medical history on file. Past Surgical History:  Procedure Laterality Date  . HERNIA REPAIR  2004   Family History  Problem Relation Age of Onset  . Diabetes Mother   . Heart disease Father   . Bipolar disorder Father   . Asthma Brother   . Bipolar disorder Maternal Uncle   . Diabetes Maternal Grandmother   . Mental illness Maternal Uncle    Social History   Tobacco Use  . Smoking status: Former Games developer  . Smokeless tobacco: Never Used  . Tobacco comment: one month ago. 09/19/15  Vaping Use  . Vaping Use: Former  Substance Use Topics  . Alcohol use: Yes    Alcohol/week: 8.0 standard drinks    Types: 8 Shots of liquor per week    Comment: daily  . Drug use: Yes    Types: Marijuana   Allergies  Allergen Reactions  . Other     Other reaction(s): Other (See Comments) Bees/wasp/hornets- swelling/cant breathe   Medications: Outpatient Medications Prior to Visit  Medication Sig  . Multiple Vitamin (MULTIVITAMIN ADULT PO) Take 2 capsules by mouth daily.  . NON FORMULARY Take 3 capsules by mouth daily. Nugenix  . sildenafil (VIAGRA) 100 MG tablet Take 1/2 to 1 tablet (50 mg to 100 mg total) by mouth daily as needed for erectile dysfunction.  . dicyclomine (BENTYL) 10 MG capsule Take 1 capsule (10 mg total)  by mouth 4 (four) times daily -  before meals and at bedtime.   No facility-administered medications prior to visit.    Review of Systems  Constitutional: Negative.   HENT: Negative.   Respiratory: Negative.   Cardiovascular: Negative.   Genitourinary:       Decreased libido with erectile dysfunction.       Objective    BP 129/82 (BP Location: Right Arm, Patient Position: Sitting, Cuff Size: Normal)   Pulse 72   Wt 218 lb (98.9 kg)   SpO2 97%   BMI 32.19 kg/m  BP Readings from Last 3 Encounters:  01/08/21 129/82  10/10/20 128/87  07/25/20 112/70   Wt Readings from Last 3 Encounters:  01/08/21 218 lb (98.9 kg)  10/10/20 200 lb (90.7 kg)  07/25/20 207 lb (93.9 kg)     Physical Exam Constitutional:      Appearance: He is well-developed.  HENT:     Head: Normocephalic and atraumatic.     Right Ear: External ear normal.     Left Ear: External ear normal.     Nose: Nose normal.  Eyes:     General:        Right eye: No discharge.     Conjunctiva/sclera: Conjunctivae normal.  Pupils: Pupils are equal, round, and reactive to light.  Neck:     Thyroid: No thyromegaly.     Trachea: No tracheal deviation.  Cardiovascular:     Rate and Rhythm: Normal rate and regular rhythm.     Heart sounds: Normal heart sounds. No murmur heard.   Pulmonary:     Effort: Pulmonary effort is normal. No respiratory distress.     Breath sounds: Normal breath sounds. No wheezing or rales.  Chest:     Chest wall: No tenderness.  Abdominal:     General: There is no distension.     Palpations: Abdomen is soft. There is no mass.     Tenderness: There is no abdominal tenderness. There is no guarding or rebound.  Genitourinary:    Penis: Normal.      Testes: Normal.     Prostate: Normal.     Rectum: Normal.  Musculoskeletal:        General: No tenderness. Normal range of motion.     Cervical back: Normal range of motion and neck supple.  Lymphadenopathy:     Cervical: No cervical  adenopathy.  Skin:    General: Skin is warm and dry.     Findings: No erythema or rash.  Neurological:     Mental Status: He is alert and oriented to person, place, and time.     Cranial Nerves: No cranial nerve deficit.     Motor: No abnormal muscle tone.     Coordination: Coordination normal.     Deep Tendon Reflexes: Reflexes are normal and symmetric. Reflexes normal.  Psychiatric:        Behavior: Behavior normal.        Thought Content: Thought content normal.        Judgment: Judgment normal.     No results found for any visits on 01/08/21.  Assessment & Plan     1. Loss of libido Several months of loss of libido with erectile dysfunction. No help from Viagra. No known injury. Check labs and follow up pending reports. - CBC with Differential/Platelet - Comprehensive metabolic panel - PSA - Testosterone,Free and Total  2. Prostate cancer screening No nodules on DRE.  - Comprehensive metabolic panel - PSA   No follow-ups on file.      I, Meer Reindl, PA-C, have reviewed all documentation for this visit. The documentation on 01/08/21 for the exam, diagnosis, procedures, and orders are all accurate and complete.    Dortha Kern, PA-C  [facility] [employer] [facility] [phone] (phone) [phone] (fax)  [facility]

## 2021-01-09 DIAGNOSIS — Z125 Encounter for screening for malignant neoplasm of prostate: Secondary | ICD-10-CM | POA: Diagnosis not present

## 2021-01-09 DIAGNOSIS — R6882 Decreased libido: Secondary | ICD-10-CM | POA: Diagnosis not present

## 2021-01-09 LAB — IFOBT (OCCULT BLOOD): IFOBT: NEGATIVE

## 2021-01-10 ENCOUNTER — Other Ambulatory Visit (INDEPENDENT_AMBULATORY_CARE_PROVIDER_SITE_OTHER): Payer: BC Managed Care – PPO

## 2021-01-10 DIAGNOSIS — Z1211 Encounter for screening for malignant neoplasm of colon: Secondary | ICD-10-CM | POA: Diagnosis not present

## 2021-01-10 DIAGNOSIS — Z125 Encounter for screening for malignant neoplasm of prostate: Secondary | ICD-10-CM | POA: Diagnosis not present

## 2021-01-10 LAB — COMPREHENSIVE METABOLIC PANEL
ALT: 26 IU/L (ref 0–44)
AST: 16 IU/L (ref 0–40)
Albumin/Globulin Ratio: 1.8 (ref 1.2–2.2)
Albumin: 4.6 g/dL (ref 4.0–5.0)
Alkaline Phosphatase: 61 IU/L (ref 44–121)
BUN/Creatinine Ratio: 13 (ref 9–20)
BUN: 14 mg/dL (ref 6–24)
Bilirubin Total: 0.9 mg/dL (ref 0.0–1.2)
CO2: 22 mmol/L (ref 20–29)
Calcium: 9.8 mg/dL (ref 8.7–10.2)
Chloride: 104 mmol/L (ref 96–106)
Creatinine, Ser: 1.09 mg/dL (ref 0.76–1.27)
Globulin, Total: 2.5 g/dL (ref 1.5–4.5)
Glucose: 122 mg/dL — ABNORMAL HIGH (ref 65–99)
Potassium: 4.5 mmol/L (ref 3.5–5.2)
Sodium: 140 mmol/L (ref 134–144)
Total Protein: 7.1 g/dL (ref 6.0–8.5)
eGFR: 87 mL/min/{1.73_m2} (ref >59)

## 2021-01-10 LAB — CBC WITH DIFFERENTIAL/PLATELET
Basophils Absolute: 0.1 10*3/uL (ref 0.0–0.2)
Basos: 1 %
EOS (ABSOLUTE): 0.3 10*3/uL (ref 0.0–0.4)
Eos: 4 %
Hematocrit: 43.2 % (ref 37.5–51.0)
Hemoglobin: 14.4 g/dL (ref 13.0–17.7)
Immature Grans (Abs): 0 10*3/uL (ref 0.0–0.1)
Immature Granulocytes: 1 %
Lymphocytes Absolute: 2.7 10*3/uL (ref 0.7–3.1)
Lymphs: 32 %
MCH: 26 pg — ABNORMAL LOW (ref 26.6–33.0)
MCHC: 33.3 g/dL (ref 31.5–35.7)
MCV: 78 fL — ABNORMAL LOW (ref 79–97)
Monocytes Absolute: 0.9 10*3/uL (ref 0.1–0.9)
Monocytes: 11 %
Neutrophils Absolute: 4.4 10*3/uL (ref 1.4–7.0)
Neutrophils: 51 %
Platelets: 265 10*3/uL (ref 150–450)
RBC: 5.53 x10E6/uL (ref 4.14–5.80)
RDW: 12.6 % (ref 11.6–15.4)
WBC: 8.4 10*3/uL (ref 3.4–10.8)

## 2021-01-10 LAB — TESTOSTERONE,FREE AND TOTAL
Testosterone, Free: 11.6 pg/mL (ref 6.8–21.5)
Testosterone: 558 ng/dL (ref 264–916)

## 2021-01-10 LAB — PSA: Prostate Specific Ag, Serum: 1 ng/mL (ref 0.0–4.0)

## 2021-01-10 NOTE — Progress Notes (Signed)
Patient brought in ifobt specimen. Results were negative.

## 2021-01-15 ENCOUNTER — Telehealth: Payer: Self-pay

## 2021-01-15 NOTE — Telephone Encounter (Signed)
Pt. Given lab results and instructions. Verbalizes understanding. Does not referral at this time.

## 2021-09-12 DIAGNOSIS — K429 Umbilical hernia without obstruction or gangrene: Secondary | ICD-10-CM | POA: Diagnosis not present

## 2021-09-12 DIAGNOSIS — Z0000 Encounter for general adult medical examination without abnormal findings: Secondary | ICD-10-CM | POA: Diagnosis not present

## 2021-09-25 DIAGNOSIS — Z0000 Encounter for general adult medical examination without abnormal findings: Secondary | ICD-10-CM | POA: Diagnosis not present

## 2021-10-07 DIAGNOSIS — K429 Umbilical hernia without obstruction or gangrene: Secondary | ICD-10-CM | POA: Diagnosis not present

## 2021-10-07 DIAGNOSIS — K4091 Unilateral inguinal hernia, without obstruction or gangrene, recurrent: Secondary | ICD-10-CM | POA: Diagnosis not present

## 2021-10-07 DIAGNOSIS — Z6831 Body mass index (BMI) 31.0-31.9, adult: Secondary | ICD-10-CM | POA: Diagnosis not present

## 2022-01-10 DIAGNOSIS — Z6829 Body mass index (BMI) 29.0-29.9, adult: Secondary | ICD-10-CM | POA: Diagnosis not present

## 2022-01-10 DIAGNOSIS — R109 Unspecified abdominal pain: Secondary | ICD-10-CM | POA: Diagnosis not present

## 2022-01-24 DIAGNOSIS — Z6829 Body mass index (BMI) 29.0-29.9, adult: Secondary | ICD-10-CM | POA: Diagnosis not present

## 2022-01-24 DIAGNOSIS — I82439 Acute embolism and thrombosis of unspecified popliteal vein: Secondary | ICD-10-CM | POA: Diagnosis not present

## 2022-01-24 DIAGNOSIS — I8392 Asymptomatic varicose veins of left lower extremity: Secondary | ICD-10-CM | POA: Diagnosis not present

## 2022-01-24 DIAGNOSIS — K921 Melena: Secondary | ICD-10-CM | POA: Diagnosis not present

## 2022-01-24 DIAGNOSIS — K429 Umbilical hernia without obstruction or gangrene: Secondary | ICD-10-CM | POA: Diagnosis not present

## 2022-01-28 DIAGNOSIS — S35514A Injury of right iliac vein, initial encounter: Secondary | ICD-10-CM | POA: Diagnosis not present

## 2022-01-28 DIAGNOSIS — M7989 Other specified soft tissue disorders: Secondary | ICD-10-CM | POA: Diagnosis not present

## 2022-01-28 DIAGNOSIS — Z5331 Laparoscopic surgical procedure converted to open procedure: Secondary | ICD-10-CM | POA: Diagnosis not present

## 2022-01-28 DIAGNOSIS — K567 Ileus, unspecified: Secondary | ICD-10-CM | POA: Diagnosis not present

## 2022-01-28 DIAGNOSIS — K660 Peritoneal adhesions (postprocedural) (postinfection): Secondary | ICD-10-CM | POA: Diagnosis not present

## 2022-01-28 DIAGNOSIS — K9189 Other postprocedural complications and disorders of digestive system: Secondary | ICD-10-CM | POA: Diagnosis not present

## 2022-01-28 DIAGNOSIS — S35514S Injury of right iliac vein, sequela: Secondary | ICD-10-CM | POA: Diagnosis not present

## 2022-01-28 DIAGNOSIS — R140 Abdominal distension (gaseous): Secondary | ICD-10-CM | POA: Diagnosis not present

## 2022-01-28 DIAGNOSIS — D649 Anemia, unspecified: Secondary | ICD-10-CM | POA: Diagnosis not present

## 2022-01-28 DIAGNOSIS — K4091 Unilateral inguinal hernia, without obstruction or gangrene, recurrent: Secondary | ICD-10-CM | POA: Diagnosis not present

## 2022-01-28 DIAGNOSIS — G8918 Other acute postprocedural pain: Secondary | ICD-10-CM | POA: Diagnosis not present

## 2022-01-28 DIAGNOSIS — R2241 Localized swelling, mass and lump, right lower limb: Secondary | ICD-10-CM | POA: Diagnosis not present

## 2022-01-28 DIAGNOSIS — D176 Benign lipomatous neoplasm of spermatic cord: Secondary | ICD-10-CM | POA: Diagnosis not present

## 2022-01-28 DIAGNOSIS — K429 Umbilical hernia without obstruction or gangrene: Secondary | ICD-10-CM | POA: Diagnosis not present

## 2022-01-28 DIAGNOSIS — I82421 Acute embolism and thrombosis of right iliac vein: Secondary | ICD-10-CM | POA: Diagnosis not present

## 2022-01-28 DIAGNOSIS — L7632 Postprocedural hematoma of skin and subcutaneous tissue following other procedure: Secondary | ICD-10-CM | POA: Diagnosis not present

## 2022-01-28 DIAGNOSIS — D62 Acute posthemorrhagic anemia: Secondary | ICD-10-CM | POA: Diagnosis not present

## 2022-01-28 DIAGNOSIS — Z4682 Encounter for fitting and adjustment of non-vascular catheter: Secondary | ICD-10-CM | POA: Diagnosis not present

## 2022-01-28 DIAGNOSIS — K589 Irritable bowel syndrome without diarrhea: Secondary | ICD-10-CM | POA: Diagnosis not present

## 2022-01-28 DIAGNOSIS — I82401 Acute embolism and thrombosis of unspecified deep veins of right lower extremity: Secondary | ICD-10-CM | POA: Diagnosis not present

## 2022-01-28 DIAGNOSIS — D72829 Elevated white blood cell count, unspecified: Secondary | ICD-10-CM | POA: Diagnosis not present

## 2022-01-28 DIAGNOSIS — R142 Eructation: Secondary | ICD-10-CM | POA: Diagnosis not present

## 2022-01-28 DIAGNOSIS — I9752 Accidental puncture and laceration of a circulatory system organ or structure during other procedure: Secondary | ICD-10-CM | POA: Diagnosis not present

## 2022-01-28 DIAGNOSIS — Z5339 Other specified procedure converted to open procedure: Secondary | ICD-10-CM | POA: Diagnosis not present

## 2022-01-28 DIAGNOSIS — J984 Other disorders of lung: Secondary | ICD-10-CM | POA: Diagnosis not present

## 2022-01-28 DIAGNOSIS — K219 Gastro-esophageal reflux disease without esophagitis: Secondary | ICD-10-CM | POA: Diagnosis not present

## 2022-01-28 DIAGNOSIS — I9742 Intraoperative hemorrhage and hematoma of a circulatory system organ or structure complicating other procedure: Secondary | ICD-10-CM | POA: Diagnosis not present

## 2022-01-28 DIAGNOSIS — Z9889 Other specified postprocedural states: Secondary | ICD-10-CM | POA: Diagnosis not present

## 2022-02-10 DIAGNOSIS — Z7901 Long term (current) use of anticoagulants: Secondary | ICD-10-CM | POA: Diagnosis not present

## 2022-02-10 DIAGNOSIS — I82401 Acute embolism and thrombosis of unspecified deep veins of right lower extremity: Secondary | ICD-10-CM | POA: Diagnosis not present

## 2022-02-10 DIAGNOSIS — K589 Irritable bowel syndrome without diarrhea: Secondary | ICD-10-CM | POA: Diagnosis not present

## 2022-02-10 DIAGNOSIS — I9752 Accidental puncture and laceration of a circulatory system organ or structure during other procedure: Secondary | ICD-10-CM | POA: Diagnosis not present

## 2022-02-10 DIAGNOSIS — I97621 Postprocedural hematoma of a circulatory system organ or structure following other procedure: Secondary | ICD-10-CM | POA: Diagnosis not present

## 2022-02-19 DIAGNOSIS — I9752 Accidental puncture and laceration of a circulatory system organ or structure during other procedure: Secondary | ICD-10-CM | POA: Diagnosis not present

## 2022-02-19 DIAGNOSIS — Z7901 Long term (current) use of anticoagulants: Secondary | ICD-10-CM | POA: Diagnosis not present

## 2022-02-19 DIAGNOSIS — I82401 Acute embolism and thrombosis of unspecified deep veins of right lower extremity: Secondary | ICD-10-CM | POA: Diagnosis not present

## 2022-02-19 DIAGNOSIS — I97621 Postprocedural hematoma of a circulatory system organ or structure following other procedure: Secondary | ICD-10-CM | POA: Diagnosis not present

## 2022-02-27 DIAGNOSIS — F330 Major depressive disorder, recurrent, mild: Secondary | ICD-10-CM | POA: Diagnosis not present

## 2022-02-27 DIAGNOSIS — F411 Generalized anxiety disorder: Secondary | ICD-10-CM | POA: Diagnosis not present

## 2022-02-28 DIAGNOSIS — Z6828 Body mass index (BMI) 28.0-28.9, adult: Secondary | ICD-10-CM | POA: Diagnosis not present

## 2022-02-28 DIAGNOSIS — S35516D Injury of unspecified iliac vein, subsequent encounter: Secondary | ICD-10-CM | POA: Diagnosis not present

## 2022-02-28 DIAGNOSIS — K429 Umbilical hernia without obstruction or gangrene: Secondary | ICD-10-CM | POA: Diagnosis not present

## 2022-03-05 DIAGNOSIS — F411 Generalized anxiety disorder: Secondary | ICD-10-CM | POA: Diagnosis not present

## 2022-03-05 DIAGNOSIS — F330 Major depressive disorder, recurrent, mild: Secondary | ICD-10-CM | POA: Diagnosis not present

## 2022-03-12 DIAGNOSIS — Z09 Encounter for follow-up examination after completed treatment for conditions other than malignant neoplasm: Secondary | ICD-10-CM | POA: Diagnosis not present

## 2022-03-12 DIAGNOSIS — S35516D Injury of unspecified iliac vein, subsequent encounter: Secondary | ICD-10-CM | POA: Diagnosis not present

## 2022-03-17 DIAGNOSIS — F330 Major depressive disorder, recurrent, mild: Secondary | ICD-10-CM | POA: Diagnosis not present

## 2022-03-17 DIAGNOSIS — F411 Generalized anxiety disorder: Secondary | ICD-10-CM | POA: Diagnosis not present

## 2022-03-19 DIAGNOSIS — I82421 Acute embolism and thrombosis of right iliac vein: Secondary | ICD-10-CM | POA: Diagnosis not present

## 2022-03-19 DIAGNOSIS — Z9862 Peripheral vascular angioplasty status: Secondary | ICD-10-CM | POA: Diagnosis not present

## 2022-03-19 DIAGNOSIS — S35514S Injury of right iliac vein, sequela: Secondary | ICD-10-CM | POA: Diagnosis not present

## 2022-03-19 DIAGNOSIS — I871 Compression of vein: Secondary | ICD-10-CM | POA: Diagnosis not present

## 2022-03-19 DIAGNOSIS — I824Y1 Acute embolism and thrombosis of unspecified deep veins of right proximal lower extremity: Secondary | ICD-10-CM | POA: Diagnosis not present

## 2022-03-19 DIAGNOSIS — I82411 Acute embolism and thrombosis of right femoral vein: Secondary | ICD-10-CM | POA: Diagnosis not present

## 2022-03-19 DIAGNOSIS — I82441 Acute embolism and thrombosis of right tibial vein: Secondary | ICD-10-CM | POA: Diagnosis not present

## 2022-03-26 DIAGNOSIS — F330 Major depressive disorder, recurrent, mild: Secondary | ICD-10-CM | POA: Diagnosis not present

## 2022-05-13 DIAGNOSIS — R030 Elevated blood-pressure reading, without diagnosis of hypertension: Secondary | ICD-10-CM | POA: Diagnosis not present

## 2022-05-13 DIAGNOSIS — R509 Fever, unspecified: Secondary | ICD-10-CM | POA: Diagnosis not present

## 2022-05-13 DIAGNOSIS — R531 Weakness: Secondary | ICD-10-CM | POA: Diagnosis not present

## 2022-05-13 DIAGNOSIS — J101 Influenza due to other identified influenza virus with other respiratory manifestations: Secondary | ICD-10-CM | POA: Diagnosis not present

## 2022-05-22 DIAGNOSIS — Z6826 Body mass index (BMI) 26.0-26.9, adult: Secondary | ICD-10-CM | POA: Diagnosis not present

## 2022-05-22 DIAGNOSIS — R634 Abnormal weight loss: Secondary | ICD-10-CM | POA: Diagnosis not present

## 2022-05-22 DIAGNOSIS — S35516D Injury of unspecified iliac vein, subsequent encounter: Secondary | ICD-10-CM | POA: Diagnosis not present

## 2022-05-22 DIAGNOSIS — R109 Unspecified abdominal pain: Secondary | ICD-10-CM | POA: Diagnosis not present

## 2022-05-30 DIAGNOSIS — R634 Abnormal weight loss: Secondary | ICD-10-CM | POA: Diagnosis not present

## 2022-05-30 DIAGNOSIS — R599 Enlarged lymph nodes, unspecified: Secondary | ICD-10-CM | POA: Diagnosis not present

## 2022-06-13 DIAGNOSIS — N469 Male infertility, unspecified: Secondary | ICD-10-CM | POA: Diagnosis not present

## 2022-06-13 DIAGNOSIS — Z3141 Encounter for fertility testing: Secondary | ICD-10-CM | POA: Diagnosis not present

## 2022-07-24 DIAGNOSIS — Z86718 Personal history of other venous thrombosis and embolism: Secondary | ICD-10-CM | POA: Diagnosis not present

## 2022-07-24 DIAGNOSIS — M79604 Pain in right leg: Secondary | ICD-10-CM | POA: Diagnosis not present

## 2022-07-24 DIAGNOSIS — M7989 Other specified soft tissue disorders: Secondary | ICD-10-CM | POA: Diagnosis not present

## 2022-07-24 DIAGNOSIS — S35514D Injury of right iliac vein, subsequent encounter: Secondary | ICD-10-CM | POA: Diagnosis not present

## 2022-07-24 DIAGNOSIS — Z7901 Long term (current) use of anticoagulants: Secondary | ICD-10-CM | POA: Diagnosis not present

## 2022-07-24 DIAGNOSIS — I82421 Acute embolism and thrombosis of right iliac vein: Secondary | ICD-10-CM | POA: Diagnosis not present

## 2022-07-24 DIAGNOSIS — S35516D Injury of unspecified iliac vein, subsequent encounter: Secondary | ICD-10-CM | POA: Diagnosis not present

## 2022-07-24 DIAGNOSIS — Y69 Unspecified misadventure during surgical and medical care: Secondary | ICD-10-CM | POA: Diagnosis not present

## 2022-08-08 DIAGNOSIS — Z23 Encounter for immunization: Secondary | ICD-10-CM | POA: Diagnosis not present

## 2022-08-08 DIAGNOSIS — R634 Abnormal weight loss: Secondary | ICD-10-CM | POA: Diagnosis not present
